# Patient Record
Sex: Female | Born: 1980 | Race: White | Hispanic: No | Marital: Married | Smoking: Current every day smoker
Health system: Southern US, Community
[De-identification: ages and names within clinical notes are randomized; demographics above are authoritative.]

## PROBLEM LIST (undated history)

## (undated) DIAGNOSIS — R002 Palpitations: Secondary | ICD-10-CM

## (undated) DIAGNOSIS — Q210 Ventricular septal defect: Secondary | ICD-10-CM

## (undated) DIAGNOSIS — F419 Anxiety disorder, unspecified: Secondary | ICD-10-CM

## (undated) HISTORY — DX: Palpitations: R00.2

## (undated) HISTORY — PX: TUBAL LIGATION: SHX77

## (undated) HISTORY — DX: Ventricular septal defect: Q21.0

---

## 2004-07-09 ENCOUNTER — Inpatient Hospital Stay (HOSPITAL_COMMUNITY): Admission: AD | Admit: 2004-07-09 | Discharge: 2004-07-09 | Payer: Self-pay | Admitting: *Deleted

## 2004-09-13 ENCOUNTER — Ambulatory Visit: Payer: Self-pay | Admitting: Obstetrics & Gynecology

## 2004-09-13 ENCOUNTER — Inpatient Hospital Stay (HOSPITAL_COMMUNITY): Admission: AD | Admit: 2004-09-13 | Discharge: 2004-09-13 | Payer: Self-pay | Admitting: Family Medicine

## 2004-10-05 ENCOUNTER — Ambulatory Visit (HOSPITAL_COMMUNITY): Admission: RE | Admit: 2004-10-05 | Discharge: 2004-10-05 | Payer: Self-pay | Admitting: Obstetrics & Gynecology

## 2004-12-21 ENCOUNTER — Ambulatory Visit (HOSPITAL_COMMUNITY): Admission: RE | Admit: 2004-12-21 | Discharge: 2004-12-21 | Payer: Self-pay | Admitting: Obstetrics & Gynecology

## 2004-12-28 ENCOUNTER — Ambulatory Visit: Payer: Self-pay | Admitting: Obstetrics and Gynecology

## 2004-12-28 ENCOUNTER — Inpatient Hospital Stay (HOSPITAL_COMMUNITY): Admission: AD | Admit: 2004-12-28 | Discharge: 2005-01-01 | Payer: Self-pay | Admitting: *Deleted

## 2004-12-31 ENCOUNTER — Ambulatory Visit: Payer: Self-pay | Admitting: Neonatology

## 2005-01-03 ENCOUNTER — Inpatient Hospital Stay (HOSPITAL_COMMUNITY): Admission: AD | Admit: 2005-01-03 | Discharge: 2005-01-03 | Payer: Self-pay | Admitting: Obstetrics and Gynecology

## 2005-01-03 ENCOUNTER — Ambulatory Visit: Payer: Self-pay | Admitting: *Deleted

## 2005-01-10 ENCOUNTER — Ambulatory Visit: Payer: Self-pay | Admitting: Family Medicine

## 2005-01-24 ENCOUNTER — Ambulatory Visit: Payer: Self-pay | Admitting: Family Medicine

## 2005-01-27 ENCOUNTER — Ambulatory Visit: Payer: Self-pay | Admitting: Certified Nurse Midwife

## 2005-01-27 ENCOUNTER — Inpatient Hospital Stay (HOSPITAL_COMMUNITY): Admission: AD | Admit: 2005-01-27 | Discharge: 2005-01-29 | Payer: Self-pay | Admitting: Family Medicine

## 2006-05-09 ENCOUNTER — Emergency Department (HOSPITAL_COMMUNITY): Admission: EM | Admit: 2006-05-09 | Discharge: 2006-05-09 | Payer: Self-pay | Admitting: Emergency Medicine

## 2006-05-30 ENCOUNTER — Emergency Department (HOSPITAL_COMMUNITY): Admission: EM | Admit: 2006-05-30 | Discharge: 2006-05-30 | Payer: Self-pay | Admitting: Emergency Medicine

## 2006-12-25 ENCOUNTER — Inpatient Hospital Stay (HOSPITAL_COMMUNITY): Admission: AD | Admit: 2006-12-25 | Discharge: 2006-12-25 | Payer: Self-pay | Admitting: Obstetrics & Gynecology

## 2007-02-11 IMAGING — US US OB LIMITED
1 series · 13 of 28 positions shown · non-contrast
Comparison: none

CLINICAL DATA: Left lower quadrant pain; assigned gestational age is 16 weeks 6 days.
TECHNIQUE: Complete ultrasound examination of the urinary tract was performed including evaluation of the kidneys, renal collecting systems, and urinary bladder.
 Both kidneys have a normal size, shape and echotexture.  The right kidney measures 11.4 cm, and the left kidney measures 11.3 cm.  No shadowing calculi or hydronephrosis are seen bilaterally.  Bilateral ureteral jets are seen within the bladder.

[Series 1: us ob limited · 0.33mm/px · 13 of 74 slices shown]
[im 3/74]
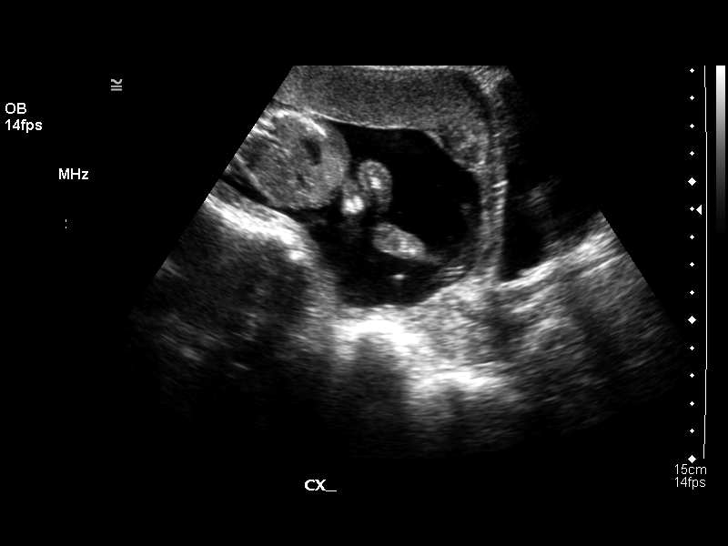
[im 9/74]
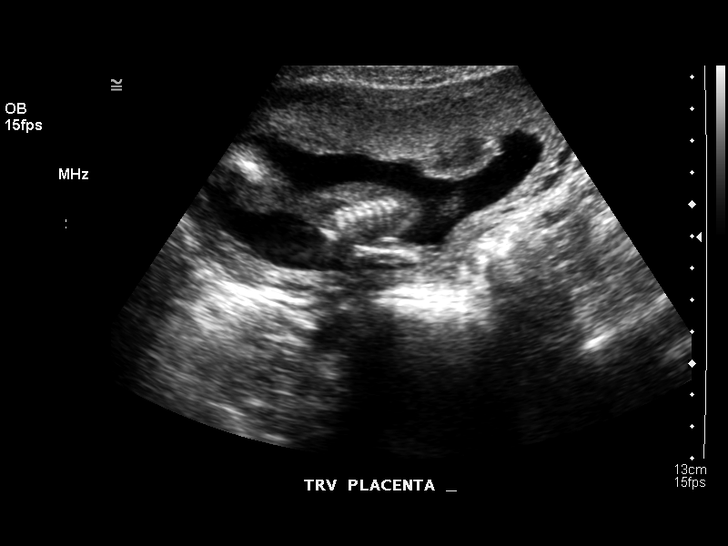
[im 14/74]
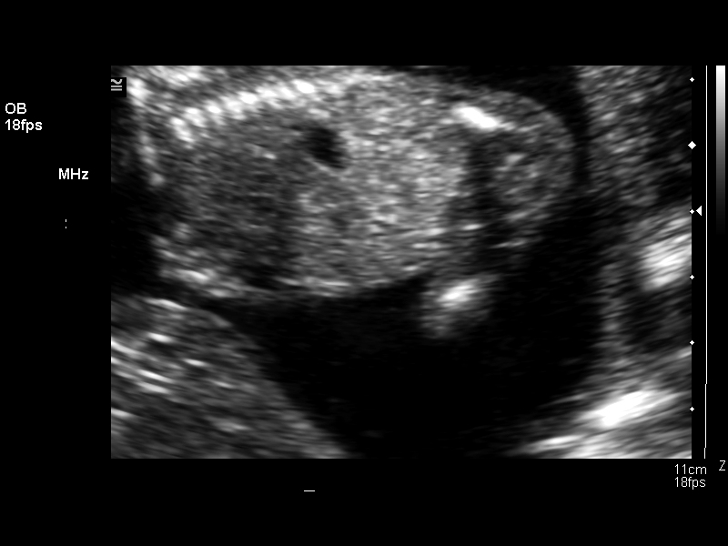
[im 19/74]
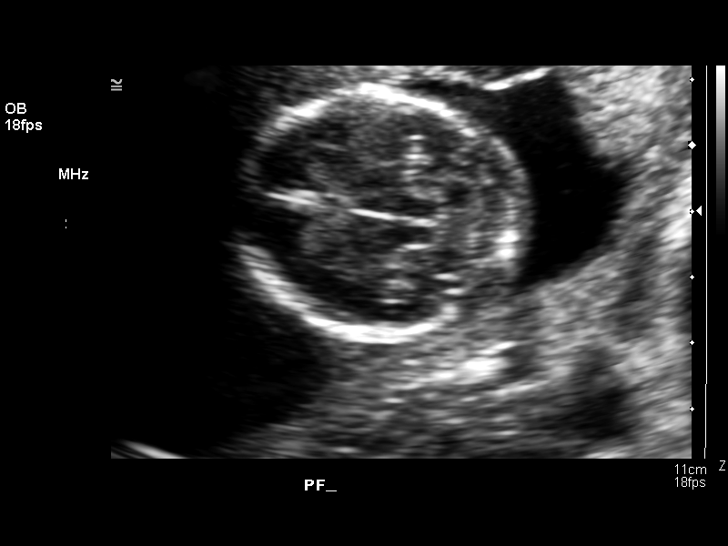
[im 25/74]
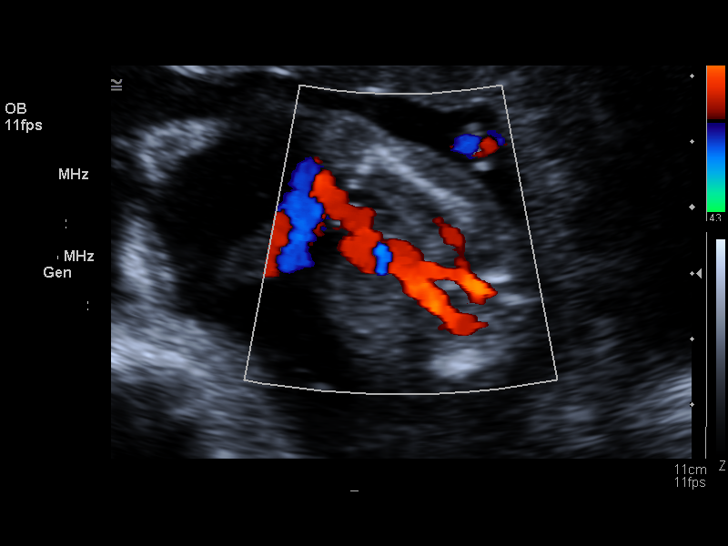
[im 30/74]
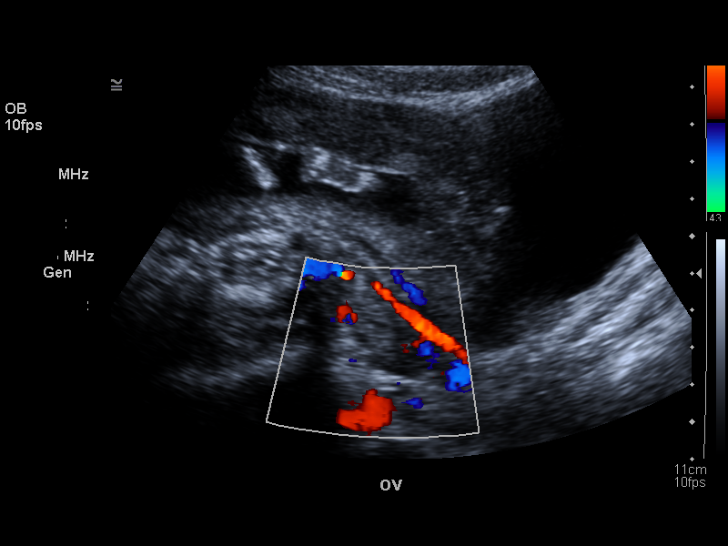
[im 38/74]
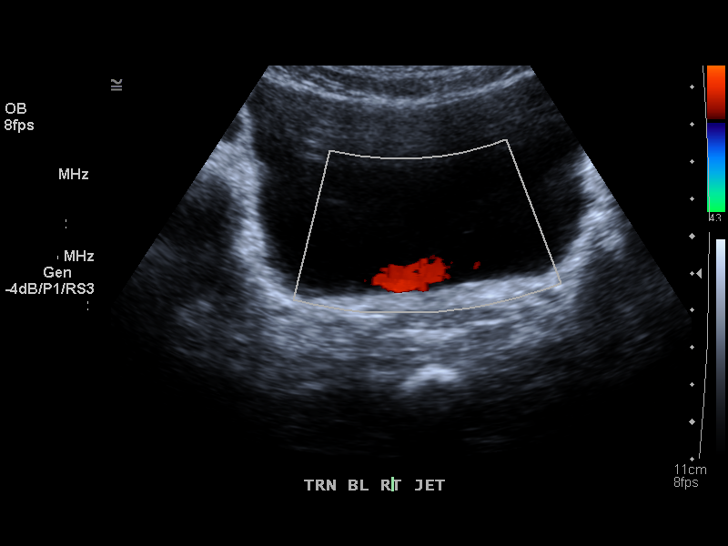
[im 44/74]
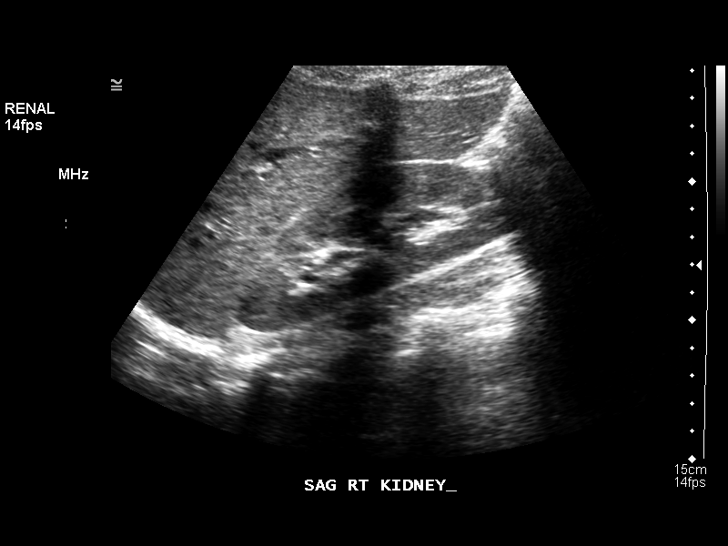
[im 49/74]
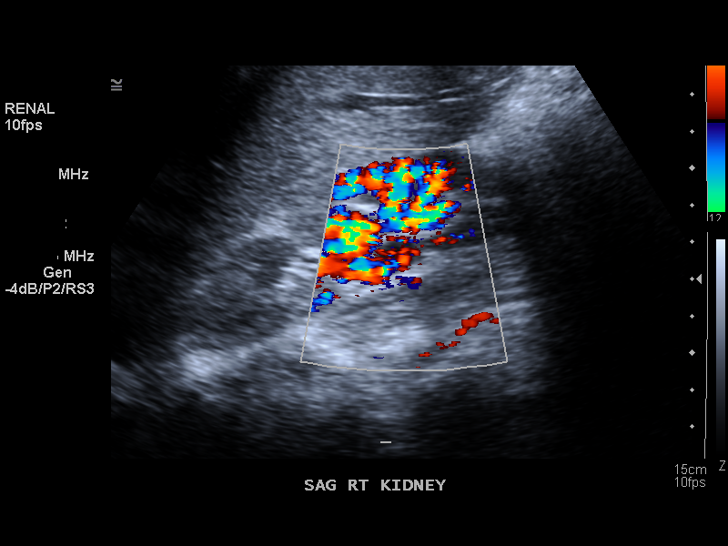
[im 55/74]
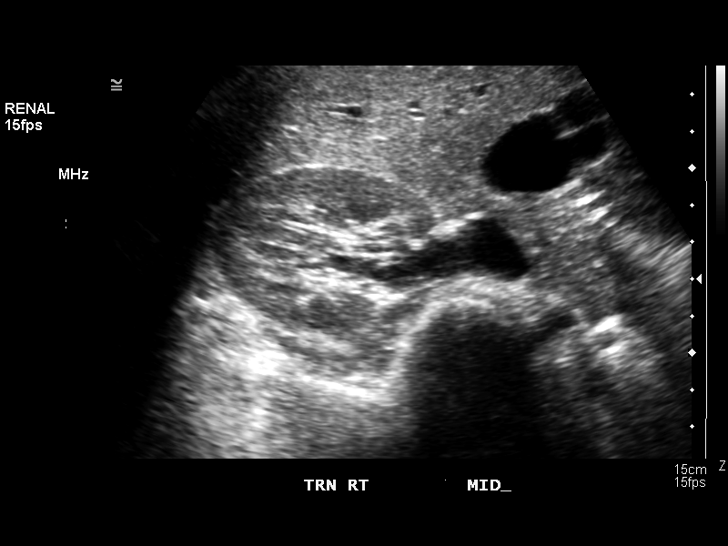
[im 60/74]
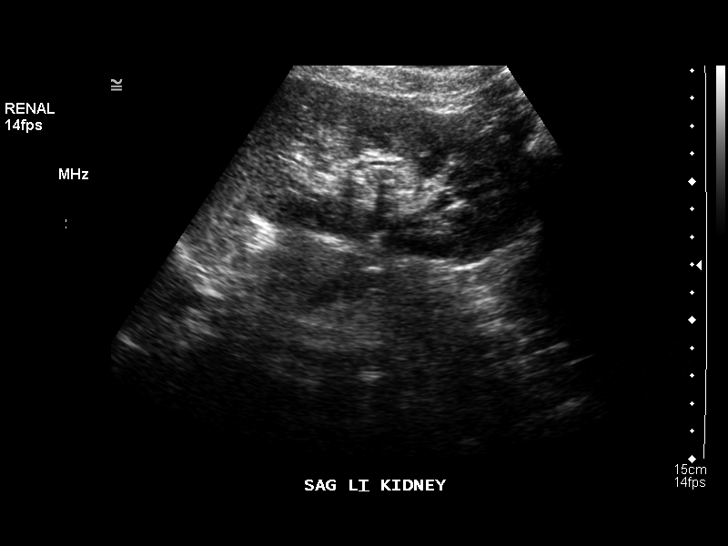
[im 65/74]
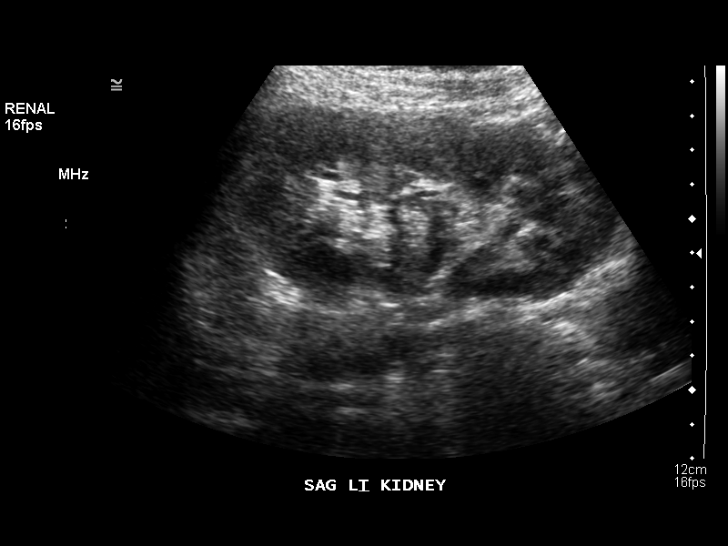
[im 71/74]
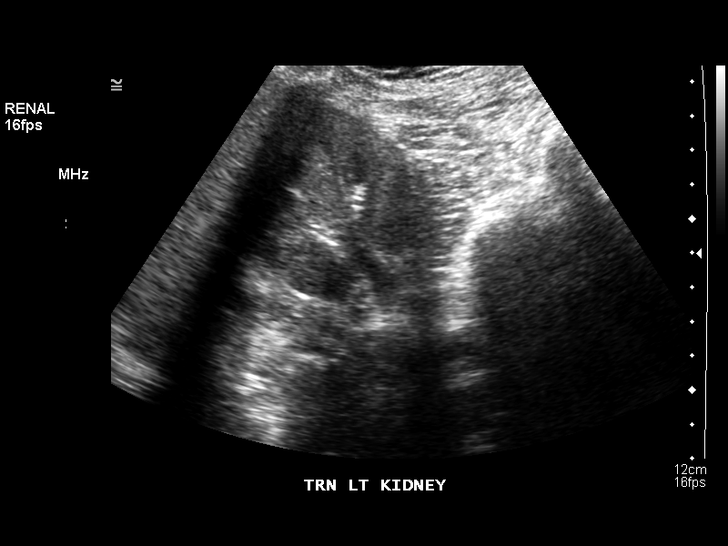

[13 of 28 positions shown; findings below may reference images not displayed]

LIMITED OBSTETRICAL ULTRASOUND:
 Number of Fetuses:  1
 Heart Rate:  153
 Movement:  Yes
 Breathing:  Yes
 Presentation:  Breech
 Placental Location:  Fundal, anterior
 Grade:  I
 Previa:  No
 Amniotic Fluid (Subjective):  Normal
 Amniotic Fluid (Objective): 4.1 cm Vertical pocket 

 Fetal measurements and complete anatomic evaluation were not requested.  The following fetal anatomy was visualized during this exam:  Choroid plexus, thalami, posterior fossa, stomach, 3-vessel cord, kidneys, 5th digit, and male genitalia.  

 MATERNAL UTERINE AND ADNEXAL FINDINGS
 Cervix:  3.2 cm Transabdominally.  The right ovary has a normal appearance and the left ovary could not be seen.  No adnexal masses or free fluid are identified.
IMPRESSION: There is a single living intrauterine gestation in variable presentation.  The visualized anatomy is unremarkable.  Full anatomy scan at 19 ? 21 weeks is recommended.  The placenta has a normal appearance with no evidence of marginal or retroplacental hemorrhage.  
 RENAL/URINARY TRACT ULTRASOUND:
IMPRESSION: Normal maternal renal ultrasound.

## 2007-06-01 ENCOUNTER — Emergency Department (HOSPITAL_COMMUNITY): Admission: EM | Admit: 2007-06-01 | Discharge: 2007-06-01 | Payer: Self-pay | Admitting: Emergency Medicine

## 2007-07-16 ENCOUNTER — Emergency Department (HOSPITAL_COMMUNITY): Admission: EM | Admit: 2007-07-16 | Discharge: 2007-07-16 | Payer: Self-pay | Admitting: Emergency Medicine

## 2007-08-19 ENCOUNTER — Encounter: Admission: RE | Admit: 2007-08-19 | Discharge: 2007-08-19 | Payer: Self-pay | Admitting: Family Medicine

## 2007-08-21 ENCOUNTER — Emergency Department (HOSPITAL_COMMUNITY): Admission: EM | Admit: 2007-08-21 | Discharge: 2007-08-21 | Payer: Self-pay | Admitting: Emergency Medicine

## 2007-10-29 ENCOUNTER — Emergency Department (HOSPITAL_COMMUNITY): Admission: EM | Admit: 2007-10-29 | Discharge: 2007-10-29 | Payer: Self-pay | Admitting: Emergency Medicine

## 2008-09-28 ENCOUNTER — Emergency Department (HOSPITAL_COMMUNITY): Admission: EM | Admit: 2008-09-28 | Discharge: 2008-09-28 | Payer: Self-pay | Admitting: Emergency Medicine

## 2008-10-27 IMAGING — CR DG HIP COMPLETE 2+V*R*
3 series · 3 of 3 positions shown · non-contrast
Comparison: none

CLINICAL DATA: Right hip pain status post fall. 
 RIGHT HIP ? 2 VIEW:

[t pelvis a.p. *]
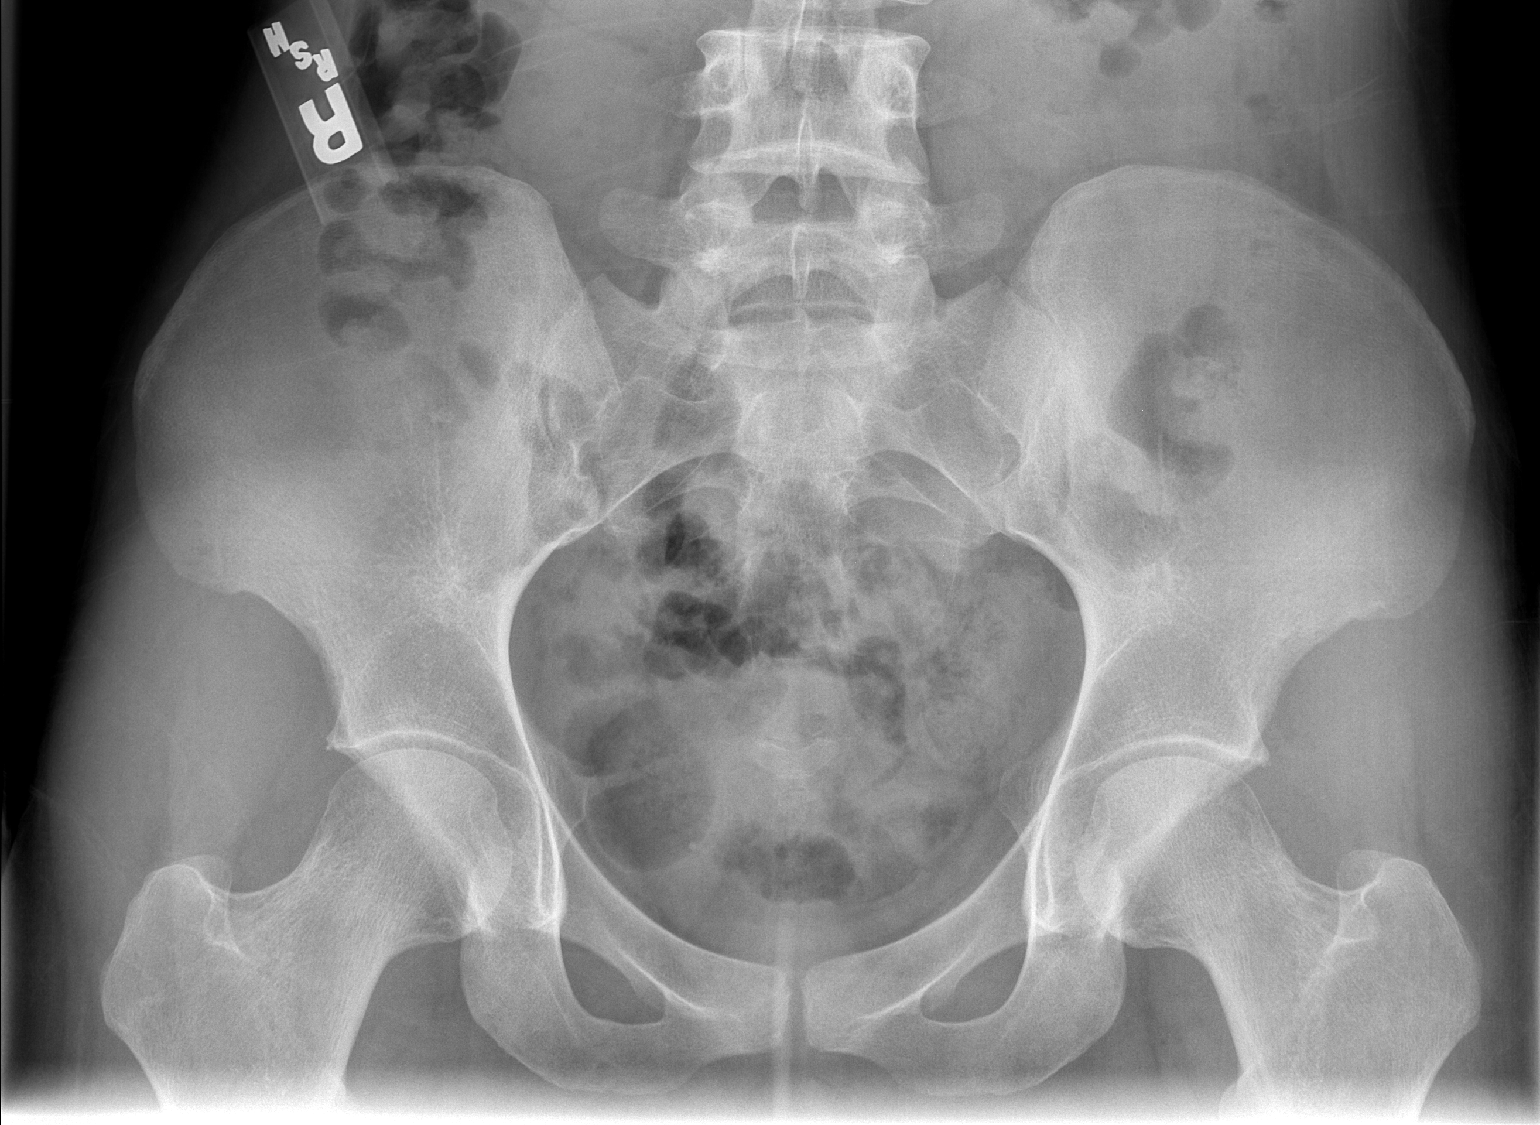

[t hip ap right *]
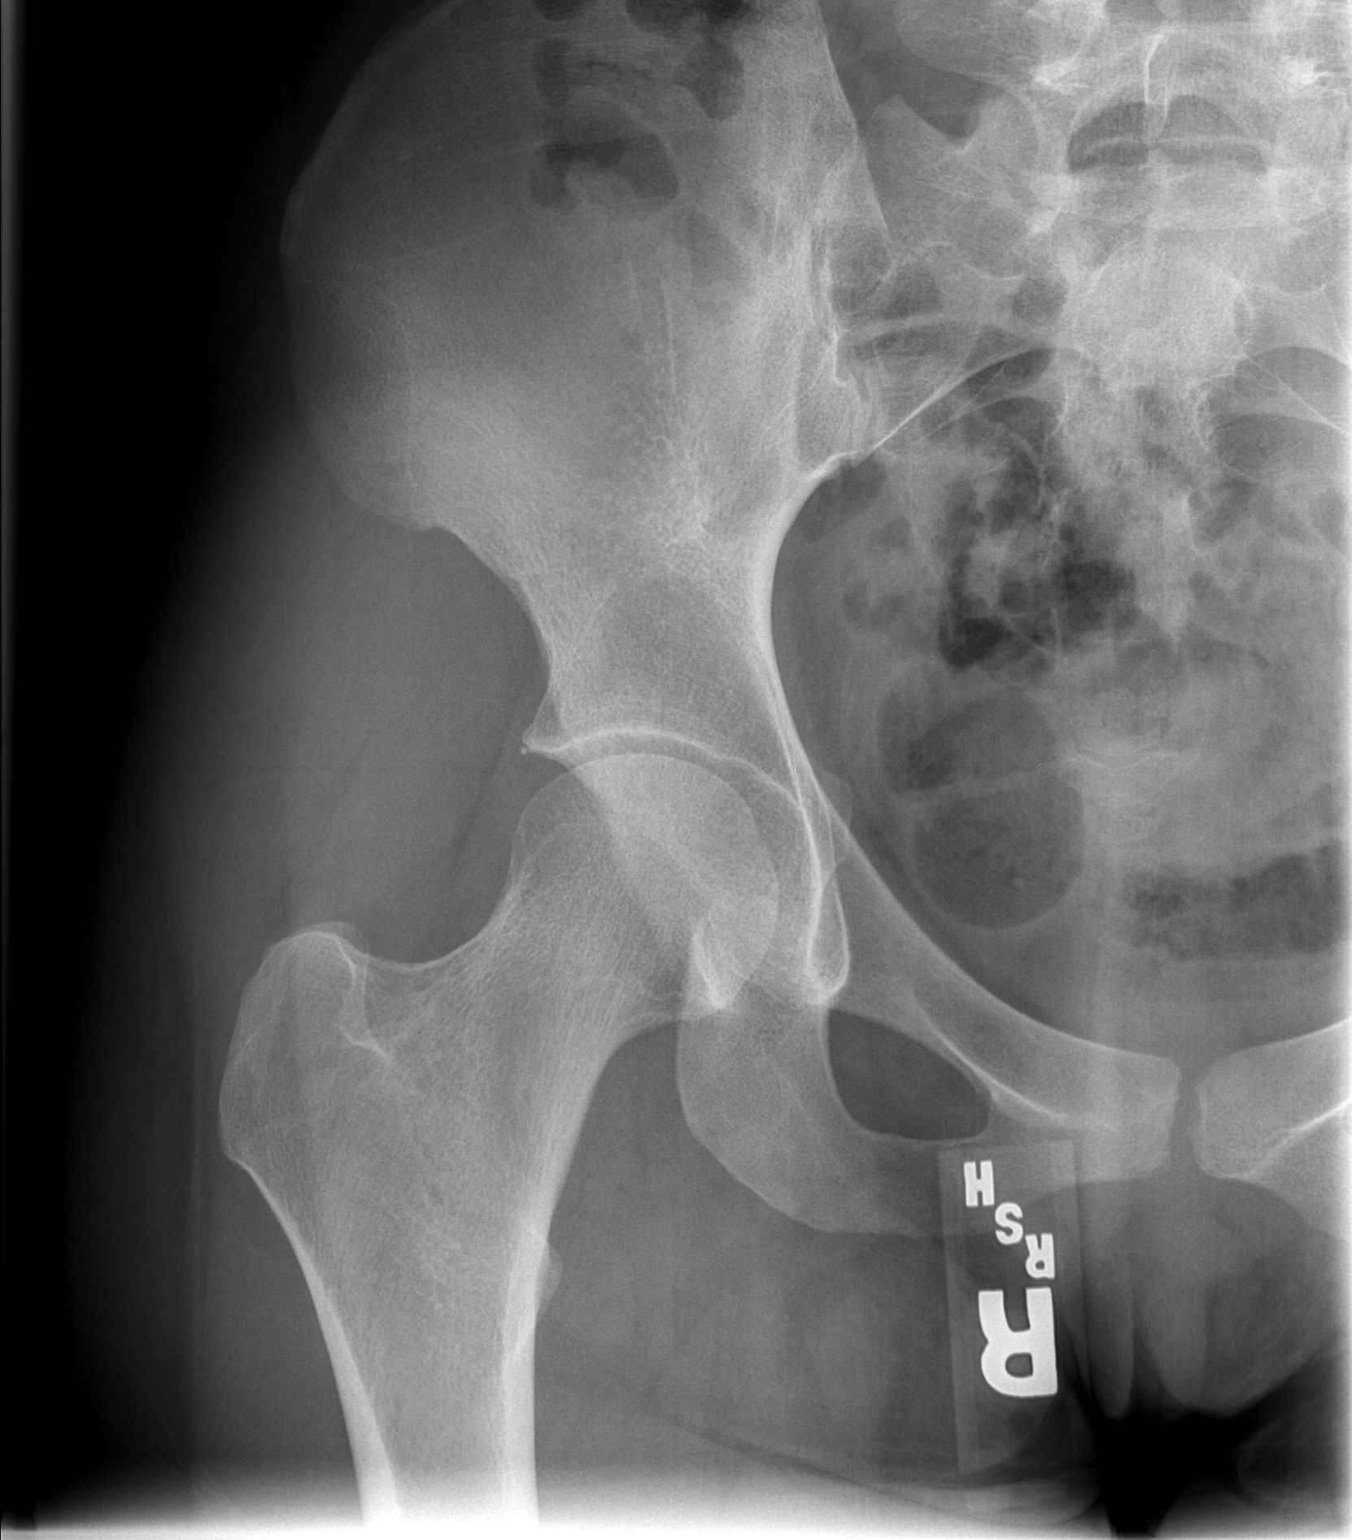

[t hip frog leg right *]
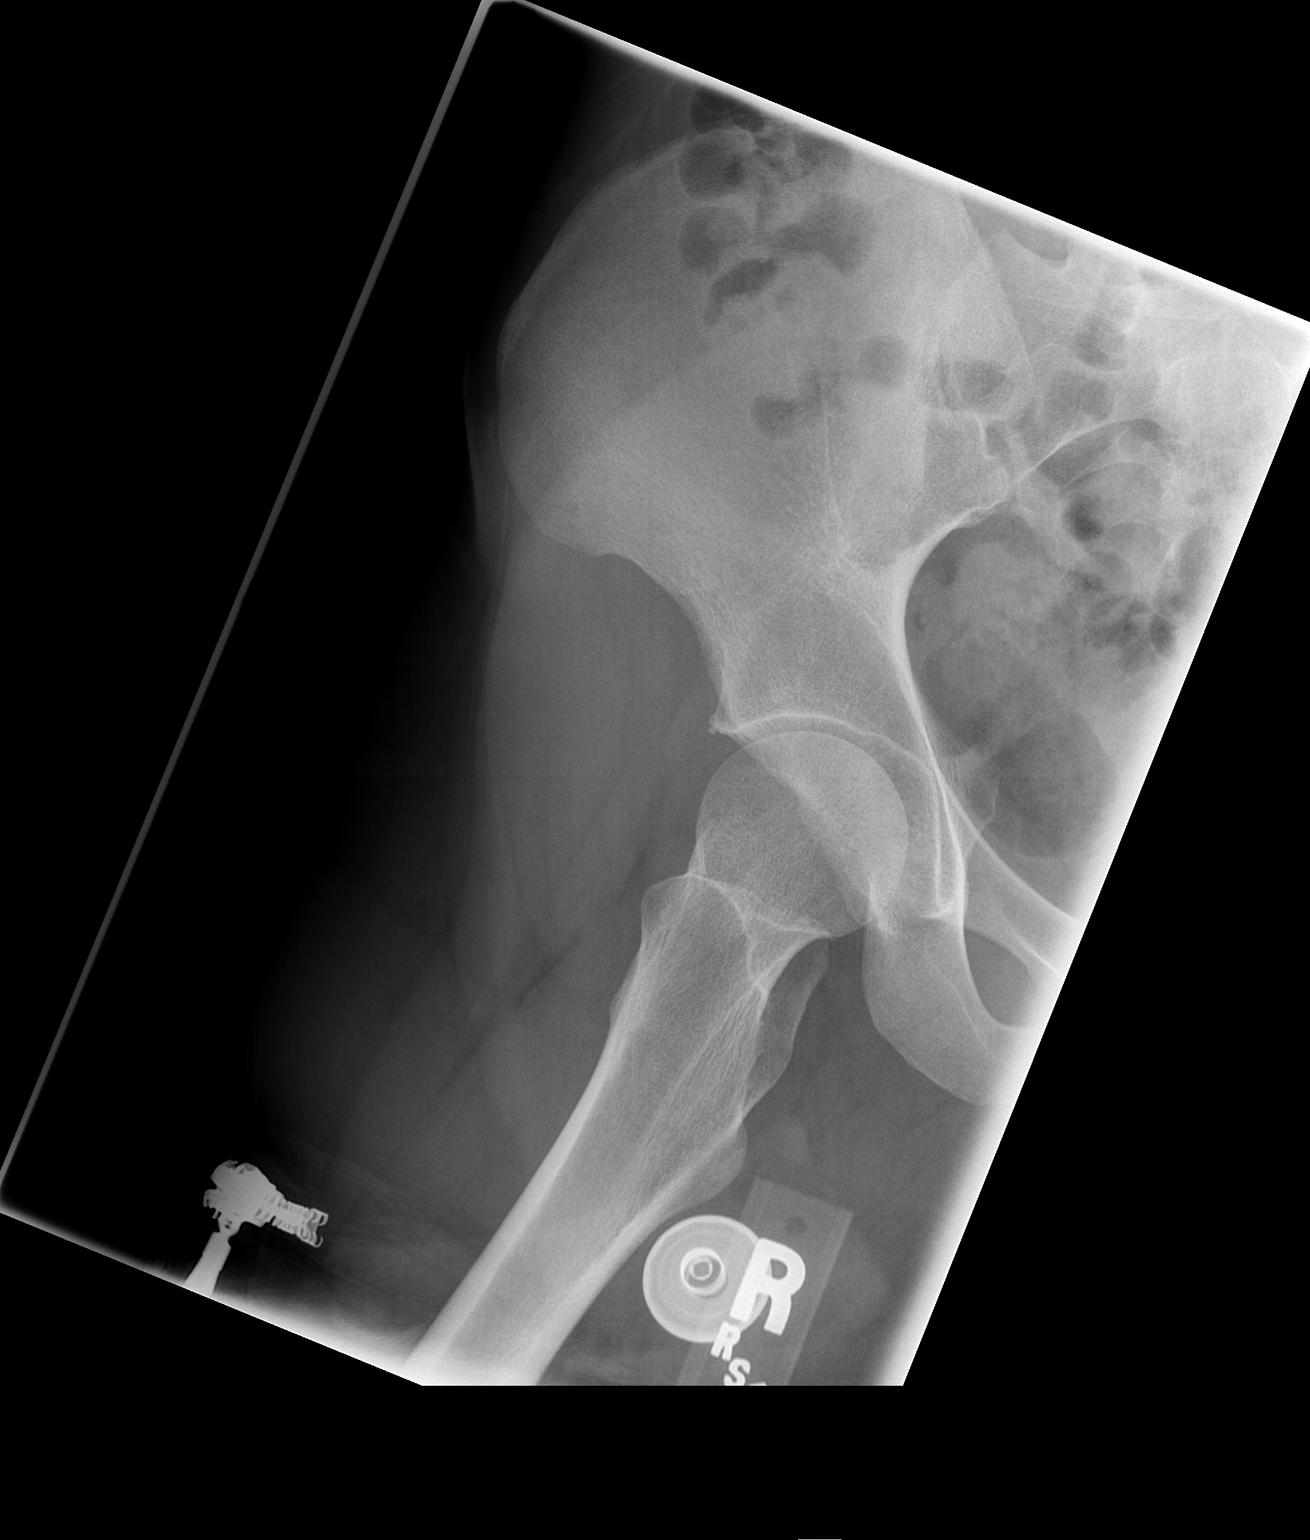

[3 of 3 positions shown; findings below may reference images not displayed]

FINDINGS: There is no evidence of hip fracture or dislocation.  There is no evidence of arthropathy or other focal bone abnormality.
IMPRESSION: Negative.

## 2009-10-03 ENCOUNTER — Inpatient Hospital Stay (HOSPITAL_COMMUNITY)
Admission: AD | Admit: 2009-10-03 | Discharge: 2009-10-03 | Payer: Self-pay | Source: Home / Self Care | Admitting: Obstetrics and Gynecology

## 2009-10-03 ENCOUNTER — Ambulatory Visit: Payer: Self-pay | Admitting: Obstetrics and Gynecology

## 2009-11-06 ENCOUNTER — Inpatient Hospital Stay (HOSPITAL_COMMUNITY)
Admission: AD | Admit: 2009-11-06 | Discharge: 2009-11-06 | Payer: Self-pay | Source: Home / Self Care | Admitting: Obstetrics & Gynecology

## 2009-11-06 ENCOUNTER — Ambulatory Visit: Payer: Self-pay | Admitting: Family

## 2009-11-23 ENCOUNTER — Ambulatory Visit: Payer: Self-pay | Admitting: Gynecology

## 2009-11-23 ENCOUNTER — Inpatient Hospital Stay (HOSPITAL_COMMUNITY)
Admission: AD | Admit: 2009-11-23 | Discharge: 2009-11-23 | Payer: Self-pay | Source: Home / Self Care | Admitting: Family Medicine

## 2009-12-14 ENCOUNTER — Ambulatory Visit (HOSPITAL_COMMUNITY): Admission: RE | Admit: 2009-12-14 | Discharge: 2009-12-14 | Payer: Self-pay | Admitting: Family Medicine

## 2009-12-26 ENCOUNTER — Encounter: Payer: Self-pay | Admitting: Cardiology

## 2010-01-09 ENCOUNTER — Encounter: Payer: Self-pay | Admitting: Family Medicine

## 2010-01-09 ENCOUNTER — Ambulatory Visit (HOSPITAL_COMMUNITY): Admission: RE | Admit: 2010-01-09 | Discharge: 2010-01-09 | Payer: Self-pay | Admitting: Family Medicine

## 2010-01-09 DIAGNOSIS — R51 Headache: Secondary | ICD-10-CM

## 2010-01-09 DIAGNOSIS — R55 Syncope and collapse: Secondary | ICD-10-CM

## 2010-01-09 DIAGNOSIS — I4949 Other premature depolarization: Secondary | ICD-10-CM

## 2010-01-09 DIAGNOSIS — R87619 Unspecified abnormal cytological findings in specimens from cervix uteri: Secondary | ICD-10-CM | POA: Insufficient documentation

## 2010-01-09 DIAGNOSIS — B977 Papillomavirus as the cause of diseases classified elsewhere: Secondary | ICD-10-CM | POA: Insufficient documentation

## 2010-01-09 DIAGNOSIS — R519 Headache, unspecified: Secondary | ICD-10-CM | POA: Insufficient documentation

## 2010-01-09 DIAGNOSIS — IMO0002 Reserved for concepts with insufficient information to code with codable children: Secondary | ICD-10-CM | POA: Insufficient documentation

## 2010-01-11 ENCOUNTER — Encounter: Payer: Self-pay | Admitting: Cardiology

## 2010-01-11 ENCOUNTER — Ambulatory Visit: Payer: Self-pay | Admitting: Cardiology

## 2010-01-11 DIAGNOSIS — I951 Orthostatic hypotension: Secondary | ICD-10-CM

## 2010-01-15 ENCOUNTER — Ambulatory Visit: Payer: Self-pay | Admitting: Obstetrics & Gynecology

## 2010-01-15 LAB — CONVERTED CEMR LAB: TSH: 0.508 microintl units/mL (ref 0.350–4.500)

## 2010-01-16 ENCOUNTER — Telehealth (INDEPENDENT_AMBULATORY_CARE_PROVIDER_SITE_OTHER): Payer: Self-pay | Admitting: *Deleted

## 2010-01-24 ENCOUNTER — Encounter: Payer: Self-pay | Admitting: Cardiology

## 2010-01-24 ENCOUNTER — Ambulatory Visit (HOSPITAL_COMMUNITY)
Admission: RE | Admit: 2010-01-24 | Discharge: 2010-01-24 | Payer: Self-pay | Source: Home / Self Care | Admitting: Cardiology

## 2010-01-24 ENCOUNTER — Ambulatory Visit: Payer: Self-pay | Admitting: Cardiology

## 2010-01-24 ENCOUNTER — Ambulatory Visit: Payer: Self-pay

## 2010-01-26 ENCOUNTER — Telehealth: Payer: Self-pay | Admitting: Cardiology

## 2010-02-09 ENCOUNTER — Inpatient Hospital Stay (HOSPITAL_COMMUNITY)
Admission: AD | Admit: 2010-02-09 | Discharge: 2010-02-09 | Payer: Self-pay | Source: Home / Self Care | Attending: Obstetrics & Gynecology | Admitting: Obstetrics & Gynecology

## 2010-02-09 ENCOUNTER — Inpatient Hospital Stay (HOSPITAL_COMMUNITY)
Admission: AD | Admit: 2010-02-09 | Discharge: 2010-02-09 | Payer: Self-pay | Source: Home / Self Care | Attending: Obstetrics and Gynecology | Admitting: Obstetrics and Gynecology

## 2010-02-12 ENCOUNTER — Ambulatory Visit: Payer: Self-pay | Admitting: Obstetrics and Gynecology

## 2010-02-12 ENCOUNTER — Encounter (INDEPENDENT_AMBULATORY_CARE_PROVIDER_SITE_OTHER): Payer: Self-pay | Admitting: *Deleted

## 2010-02-12 LAB — CONVERTED CEMR LAB
HIV: NONREACTIVE
MCV: 90.7 fL (ref 78.0–100.0)
RBC: 4.1 M/uL (ref 3.87–5.11)
WBC: 11.1 10*3/uL — ABNORMAL HIGH (ref 4.0–10.5)

## 2010-02-26 ENCOUNTER — Ambulatory Visit: Payer: Self-pay | Admitting: Obstetrics & Gynecology

## 2010-03-09 ENCOUNTER — Inpatient Hospital Stay (HOSPITAL_COMMUNITY)
Admission: AD | Admit: 2010-03-09 | Discharge: 2010-03-09 | Payer: Self-pay | Source: Home / Self Care | Attending: Obstetrics & Gynecology | Admitting: Obstetrics & Gynecology

## 2010-03-12 ENCOUNTER — Ambulatory Visit
Admission: RE | Admit: 2010-03-12 | Discharge: 2010-03-12 | Payer: Self-pay | Source: Home / Self Care | Attending: Obstetrics and Gynecology | Admitting: Obstetrics and Gynecology

## 2010-03-12 LAB — WET PREP, GENITAL
Clue Cells Wet Prep HPF POC: NONE SEEN
Trich, Wet Prep: NONE SEEN
Yeast Wet Prep HPF POC: NONE SEEN

## 2010-03-12 LAB — URINALYSIS, ROUTINE W REFLEX MICROSCOPIC
Bilirubin Urine: NEGATIVE
Hgb urine dipstick: NEGATIVE
Ketones, ur: NEGATIVE mg/dL
Nitrite: NEGATIVE
Protein, ur: NEGATIVE mg/dL
Specific Gravity, Urine: 1.015 (ref 1.005–1.030)
Urine Glucose, Fasting: NEGATIVE mg/dL
Urobilinogen, UA: 0.2 mg/dL (ref 0.0–1.0)
pH: 7 (ref 5.0–8.0)

## 2010-03-12 LAB — GC/CHLAMYDIA PROBE AMP, GENITAL
Chlamydia, DNA Probe: NEGATIVE
GC Probe Amp, Genital: NEGATIVE

## 2010-03-14 LAB — POCT URINALYSIS DIPSTICK
Bilirubin Urine: NEGATIVE
Ketones, ur: NEGATIVE mg/dL
Nitrite: NEGATIVE
Protein, ur: NEGATIVE mg/dL
Specific Gravity, Urine: 1.01 (ref 1.005–1.030)
Urine Glucose, Fasting: NEGATIVE mg/dL
Urobilinogen, UA: 0.2 mg/dL (ref 0.0–1.0)
pH: 6.5 (ref 5.0–8.0)

## 2010-03-16 ENCOUNTER — Inpatient Hospital Stay (HOSPITAL_COMMUNITY)
Admission: AD | Admit: 2010-03-16 | Discharge: 2010-03-16 | Payer: Self-pay | Source: Home / Self Care | Admitting: Obstetrics & Gynecology

## 2010-03-19 ENCOUNTER — Ambulatory Visit
Admission: RE | Admit: 2010-03-19 | Discharge: 2010-03-19 | Payer: Self-pay | Source: Home / Self Care | Attending: Obstetrics & Gynecology | Admitting: Obstetrics & Gynecology

## 2010-03-19 ENCOUNTER — Inpatient Hospital Stay (HOSPITAL_COMMUNITY)
Admission: AD | Admit: 2010-03-19 | Discharge: 2010-03-19 | Payer: Self-pay | Source: Home / Self Care | Attending: Obstetrics & Gynecology | Admitting: Obstetrics & Gynecology

## 2010-03-19 LAB — WET PREP, GENITAL
Clue Cells Wet Prep HPF POC: NONE SEEN
Trich, Wet Prep: NONE SEEN

## 2010-03-19 LAB — URINALYSIS, ROUTINE W REFLEX MICROSCOPIC
Protein, ur: NEGATIVE mg/dL
Urobilinogen, UA: 0.2 mg/dL (ref 0.0–1.0)

## 2010-03-20 LAB — POCT URINALYSIS DIPSTICK
Ketones, ur: NEGATIVE mg/dL
Protein, ur: NEGATIVE mg/dL
pH: 7 (ref 5.0–8.0)

## 2010-03-20 LAB — URINALYSIS, ROUTINE W REFLEX MICROSCOPIC
Bilirubin Urine: NEGATIVE
Nitrite: NEGATIVE
Specific Gravity, Urine: 1.01 (ref 1.005–1.030)
pH: 6.5 (ref 5.0–8.0)

## 2010-03-20 LAB — WET PREP, GENITAL

## 2010-03-20 LAB — GC/CHLAMYDIA PROBE AMP, GENITAL: GC Probe Amp, Genital: NEGATIVE

## 2010-03-26 ENCOUNTER — Ambulatory Visit: Admit: 2010-03-26 | Payer: Self-pay | Admitting: Obstetrics & Gynecology

## 2010-03-27 NOTE — Progress Notes (Signed)
Summary: Echo results  Phone Note Call from Patient Call back at Home Phone [phone]   Caller: Patient Summary of Call: Echo results Initial call taken by: Judie Grieve,  January 26, 2010 8:11 AM  Follow-up for Phone Call        I talked with pt and gave her echo results from 01/24/10

## 2010-03-27 NOTE — Assessment & Plan Note (Signed)
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 couamdin/ pt has ...   Visit Type:  new pt visit Referring Provider:  [facility] Primary Provider:  [facility] Dept.  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  History of Present Illness: Kaitlyn Vargas comes in today referred by Dr. 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  She is 30 years old. She is currently [redacted] weeks pregnant. This is her third pregnancy with one successful delivery. The other was a miscarriage.  She is a history of recurrent syncope particular last couple years. It can happen when she stands up too quickly and has happened getting out of a hot shower. Sometimes she just gets lightheaded when she bends over and then goes out.  She has a history of gestational hypertension. She says she drinks plenty of fluids.  He was told that she had a hole in her heart which she was born to close. We do not have any records.  She was evaluated by Dr. Caryn Section in  [place]. she was placed on Coumadin because of scar tissue in her heart. We do not have any records but will send for those. She stopped it on her own because it was causing the site more. She denied any bleeding or melena.  She has been seen in the emergency room for syncope. This was witnessed by her husband. She hit the right top of her head. Apparently he reported her going in and out 10 different times. She was shaking but has no history of seizure.  Her blood sugar at that time by EMS was 131  His chest x-ray in the chart from September of 2009. Heart size that time was normal lungs were hyperaerated.  Review of her clinic records shows a hemoglobin of 12.0.  Current Medications  (verified): 1)  Prenatal Vitamins 0.8 Mg Tabs (Prenatal Multivit-Min-Fe-Fa) .Marland Kitchen.. 1 Tab Once Daily  Allergies: 1)  ! Pcn 2)  ! Sulfa  Past History:  Past Medical History: Last updated: 01/09/2010 PREMATURE VENTRICULAR CONTRACTIONS (ICD-427.69) SYNCOPE (ICD-780.2) HYPERTENSION, GESTATIONAL (ICD-642.90) HEADACHE (ICD-784.0) PAP SMEAR, ABNORMAL (ICD-795.00) HUMAN PAPILLOMAVIRUS (ICD-079.4)  Past Surgical History: Last updated: 01/09/2010 No surgery to report  Family History: Last updated: 01/09/2010 Family History of Coronary Artery Disease:  Family History of Hypertension:  Family History of Diabetes:  Family H/O Seizure disorder  Social History: Last updated: 01/09/2010 Tobacco Use - Former.  Alcohol Use - no Drug Use - no..WAS USING MARIJUANA UNTIL POSITIVE PREGNANCY TEST AS PER 11/23/09 ED NOTE FROM [facility]  Risk Factors: Smoking Status: quit (01/09/2010)  Review of Systems       negative other than history of present illness  Vital Signs:  Patient profile:   30 year old female Height:      64 inches Weight:      135.12 pounds BMI:     23.28 Pulse rate:   80 / minute Pulse (ortho):   78 / minute Pulse rhythm:   regular BP sitting:   102 / 60  (left arm) Cuff size:   large  Vitals Entered By: Danielle Rankin, CMA (January 11, 2010 3:17 PM)  Serial Vital Signs/Assessments:  Time  Position  BP       Pulse  Resp  Temp     By 3:31 PM   Lying LA  90/54    76                    Danielle Rankin, [place] 3:33 PM   Sitting   92/57    70                    Danielle Rankin, [place] 3:34 PM   Standing  101/61   78                    Danielle Rankin, [place] 3:36 PM   Standing  106/58   82                    Danielle Rankin, [place] 3:38 PM   Standing  102/60   87                    Danielle Rankin, [place]  Comments: 3:31 PM no sxms By: Danielle Rankin, CMA  3:33 PM no sxms By: Danielle Rankin, CMA  3:34 PM no sxms By: Danielle Rankin, CMA  3:36 PM no sxms By: Danielle Rankin, CMA  3:38 PM no  sxms By: Danielle Rankin, CMA    Physical Exam  General:  Well developed, well nourished, in no acute distress. Head:  normocephalic and atraumatic Eyes:  PERRLA/EOM intact; conjunctiva and lids normal. Mouth:  poor dentition.   Neck:  Neck supple, no JVD. No masses, thyromegaly or abnormal cervical nodes. Chest Wall:  no deformities or breast masses noted Lungs:  Clear bilaterally to auscultation and percussion. Heart:  PMI nondisplaced, regular rate and rhythm, normal S1-S2, no significant murmur systolic or diastolic. Carotids shows equal bilaterally without bruits Abdomen:  gravida, good bowel sounds Msk:  Back normal, normal gait. Muscle strength and tone normal. Pulses:  pulses normal in all 4 extremities Extremities:  No clubbing or cyanosis. Neurologic:  Alert and oriented x 3. Skin:  Intact without lesions or rashes. Psych:  Normal affect.   Problems:  Medical Problems Added: 1)  Dx of Orthostatic Hypotension  (ICD-458.0)  Impression & Recommendations:  Problem # 1:  SYNCOPE (ICD-780.2) Assessment Deteriorated  I suspect this is vasovagal or neurocardiogenic syncope. She is orthostatic by exam. I have asked her to push fluids. We'll have to be careful with salt with her history of gestational hypertension in the past. We'll check a 2-D echocardiogram to rule out any pulmonary hypertension or any structural abnormality. I suspect it'll be normal. We'll also try to get the records from Dr. Caryn Section and we looked up on the Internet. She has signed a release. Orthostatic because his given and how to avoid syncope even with pregnancy reviewed at length.  Orders: EKG w/ Interpretation (93000) Echocardiogram (Echo)  Problem # 2:  HYPERTENSION, GESTATIONAL (ICD-642.90)  Problem # 3:  PREMATURE VENTRICULAR CONTRACTIONS (ICD-427.69) Assessment: Unchanged  Problem # 4:  ORTHOSTATIC HYPOTENSION (ICD-458.0) Assessment: New  Patient Instructions: 1)  Your physician recommends that  you schedule a follow-up appointment in: 6 months with Dr. Daleen Squibb 2)  Your physician recommends that you continue on your current medications as directed. 3)  Your physician has requested that you have an echocardiogram.  Echocardiography is a painless test that uses sound waves to create images of your heart. It provides your doctor with information about the size and shape of your  heart and how well your heart's chambers and valves are working.  This procedure takes approximately one hour. There are no restrictions for this procedure. 4)  Increase fluids and continue to watch your salt.

## 2010-03-27 NOTE — Progress Notes (Signed)
  LMOM for Pt to call back. Cala Bradford Mesiemore  January 16, 2010 11:00 AM      Appended Document:  Spoke with pt @ 1:50 today, Dr.Fox's Office is no longer in [place] so no is able to get records from his office. Will flag Debbie and let her know   Appended Document:   Reviewed Juanito Doom, MD

## 2010-03-29 NOTE — Progress Notes (Signed)
Summary: [facility]   [facility] Health[facility]   Imported By: Roderic Ovens 02/06/2010 11:27:08  _____________________________________________________________________  External Attachment:    Type:   Image     Comment:   External Document

## 2010-04-02 ENCOUNTER — Other Ambulatory Visit: Payer: Self-pay

## 2010-04-02 DIAGNOSIS — O0930 Supervision of pregnancy with insufficient antenatal care, unspecified trimester: Secondary | ICD-10-CM

## 2010-04-02 DIAGNOSIS — O99810 Abnormal glucose complicating pregnancy: Secondary | ICD-10-CM

## 2010-04-02 DIAGNOSIS — O09219 Supervision of pregnancy with history of pre-term labor, unspecified trimester: Secondary | ICD-10-CM

## 2010-04-02 LAB — POCT URINALYSIS DIPSTICK
Bilirubin Urine: NEGATIVE
Ketones, ur: NEGATIVE mg/dL
Urine Glucose, Fasting: NEGATIVE mg/dL

## 2010-04-18 ENCOUNTER — Encounter: Payer: Self-pay | Admitting: Obstetrics and Gynecology

## 2010-04-18 ENCOUNTER — Other Ambulatory Visit: Payer: Self-pay

## 2010-04-18 DIAGNOSIS — O09219 Supervision of pregnancy with history of pre-term labor, unspecified trimester: Secondary | ICD-10-CM

## 2010-04-18 LAB — POCT URINALYSIS DIPSTICK
Bilirubin Urine: NEGATIVE
Ketones, ur: NEGATIVE mg/dL
Specific Gravity, Urine: 1.01 (ref 1.005–1.030)
Urine Glucose, Fasting: NEGATIVE mg/dL

## 2010-04-18 LAB — CONVERTED CEMR LAB
Chlamydia, DNA Probe: NEGATIVE
GC Probe Amp, Genital: NEGATIVE

## 2010-04-19 ENCOUNTER — Encounter: Payer: Self-pay | Admitting: Obstetrics and Gynecology

## 2010-04-24 ENCOUNTER — Inpatient Hospital Stay (HOSPITAL_COMMUNITY)
Admission: AD | Admit: 2010-04-24 | Discharge: 2010-04-24 | Disposition: A | Discharge status: Home / Self Care | Payer: Medicaid Other | Source: Ambulatory Visit | Attending: Obstetrics & Gynecology | Admitting: Obstetrics & Gynecology

## 2010-04-24 DIAGNOSIS — O479 False labor, unspecified: Secondary | ICD-10-CM | POA: Insufficient documentation

## 2010-04-25 ENCOUNTER — Inpatient Hospital Stay (HOSPITAL_COMMUNITY)
Admission: AD | Admit: 2010-04-25 | Discharge: 2010-04-25 | Disposition: A | Discharge status: Home / Self Care | Payer: Medicaid Other | Source: Ambulatory Visit | Attending: Obstetrics & Gynecology | Admitting: Obstetrics & Gynecology

## 2010-04-25 DIAGNOSIS — S7000XA Contusion of unspecified hip, initial encounter: Secondary | ICD-10-CM

## 2010-04-25 DIAGNOSIS — O99891 Other specified diseases and conditions complicating pregnancy: Secondary | ICD-10-CM | POA: Insufficient documentation

## 2010-04-25 DIAGNOSIS — W010XXA Fall on same level from slipping, tripping and stumbling without subsequent striking against object, initial encounter: Secondary | ICD-10-CM | POA: Insufficient documentation

## 2010-04-25 DIAGNOSIS — O9989 Other specified diseases and conditions complicating pregnancy, childbirth and the puerperium: Secondary | ICD-10-CM

## 2010-04-25 DIAGNOSIS — Y92009 Unspecified place in unspecified non-institutional (private) residence as the place of occurrence of the external cause: Secondary | ICD-10-CM | POA: Insufficient documentation

## 2010-04-26 ENCOUNTER — Inpatient Hospital Stay (HOSPITAL_COMMUNITY): Payer: Medicaid Other

## 2010-04-26 ENCOUNTER — Inpatient Hospital Stay (HOSPITAL_COMMUNITY)
Admission: AD | Admit: 2010-04-26 | Discharge: 2010-04-26 | Disposition: A | Discharge status: Home / Self Care | Payer: Medicaid Other | Source: Ambulatory Visit | Attending: Obstetrics & Gynecology | Admitting: Obstetrics & Gynecology

## 2010-04-26 DIAGNOSIS — O479 False labor, unspecified: Secondary | ICD-10-CM | POA: Insufficient documentation

## 2010-05-03 ENCOUNTER — Other Ambulatory Visit: Payer: Self-pay

## 2010-05-03 DIAGNOSIS — O0930 Supervision of pregnancy with insufficient antenatal care, unspecified trimester: Secondary | ICD-10-CM

## 2010-05-03 DIAGNOSIS — O09219 Supervision of pregnancy with history of pre-term labor, unspecified trimester: Secondary | ICD-10-CM

## 2010-05-03 DIAGNOSIS — O99330 Smoking (tobacco) complicating pregnancy, unspecified trimester: Secondary | ICD-10-CM

## 2010-05-03 LAB — POCT URINALYSIS DIPSTICK
Bilirubin Urine: NEGATIVE
Ketones, ur: NEGATIVE mg/dL
Nitrite: NEGATIVE

## 2010-05-07 LAB — URINALYSIS, ROUTINE W REFLEX MICROSCOPIC
Ketones, ur: NEGATIVE mg/dL
Leukocytes, UA: NEGATIVE
Nitrite: NEGATIVE
Specific Gravity, Urine: <1.005 — ABNORMAL LOW (ref 1.005–1.030)
Urobilinogen, UA: 0.2 mg/dL (ref 0.0–1.0)
pH: 6 (ref 5.0–8.0)

## 2010-05-07 LAB — POCT URINALYSIS DIPSTICK
Bilirubin Urine: NEGATIVE
Bilirubin Urine: NEGATIVE
Glucose, UA: NEGATIVE mg/dL
Glucose, UA: NEGATIVE mg/dL
Hgb urine dipstick: NEGATIVE
Ketones, ur: NEGATIVE mg/dL
Nitrite: NEGATIVE
Nitrite: NEGATIVE
Urobilinogen, UA: 0.2 mg/dL (ref 0.0–1.0)
pH: 7 (ref 5.0–8.0)

## 2010-05-07 LAB — URINE MICROSCOPIC-ADD ON

## 2010-05-07 LAB — WET PREP, GENITAL: Clue Cells Wet Prep HPF POC: NONE SEEN

## 2010-05-07 LAB — GC/CHLAMYDIA PROBE AMP, GENITAL: Chlamydia, DNA Probe: NEGATIVE

## 2010-05-08 ENCOUNTER — Inpatient Hospital Stay (HOSPITAL_COMMUNITY)
Admission: AD | Admit: 2010-05-08 | Discharge: 2010-05-10 | DRG: 767 | Disposition: A | Discharge status: Home / Self Care | Payer: Medicaid Other | Source: Ambulatory Visit | Attending: Obstetrics & Gynecology | Admitting: Obstetrics & Gynecology

## 2010-05-08 DIAGNOSIS — Z302 Encounter for sterilization: Secondary | ICD-10-CM

## 2010-05-08 DIAGNOSIS — O0940 Supervision of pregnancy with grand multiparity, unspecified trimester: Secondary | ICD-10-CM

## 2010-05-08 LAB — POCT URINALYSIS DIPSTICK
Bilirubin Urine: NEGATIVE
Glucose, UA: NEGATIVE mg/dL
Ketones, ur: NEGATIVE mg/dL
Specific Gravity, Urine: 1.015 (ref 1.005–1.030)

## 2010-05-08 LAB — CBC
HCT: 42.1 % (ref 36.0–46.0)
Hemoglobin: 14.5 g/dL (ref 12.0–15.0)
MCV: 90 fL (ref 78.0–100.0)
RBC: 4.68 MIL/uL (ref 3.87–5.11)
RDW: 13.2 % (ref 11.5–15.5)
WBC: 18.8 10*3/uL — ABNORMAL HIGH (ref 4.0–10.5)

## 2010-05-09 DIAGNOSIS — Z302 Encounter for sterilization: Secondary | ICD-10-CM

## 2010-05-09 LAB — SURGICAL PCR SCREEN
MRSA, PCR: NEGATIVE
Staphylococcus aureus: NEGATIVE

## 2010-05-10 LAB — COMPREHENSIVE METABOLIC PANEL
ALT: 13 U/L (ref 0–35)
AST: 15 U/L (ref 0–37)
Alkaline Phosphatase: 37 U/L — ABNORMAL LOW (ref 39–117)
CO2: 20 mEq/L (ref 19–32)
Calcium: 9.1 mg/dL (ref 8.4–10.5)
GFR calc Af Amer: >60 mL/min (ref >60)
GFR calc non Af Amer: >60 mL/min (ref >60)
Glucose, Bld: 95 mg/dL (ref 70–99)
Potassium: 3.1 mEq/L — ABNORMAL LOW (ref 3.5–5.1)
Sodium: 132 mEq/L — ABNORMAL LOW (ref 135–145)
Total Protein: 7.4 g/dL (ref 6.0–8.3)

## 2010-05-10 LAB — CBC
HCT: 40.3 % (ref 36.0–46.0)
Hemoglobin: 13.9 g/dL (ref 12.0–15.0)
MCHC: 34.5 g/dL (ref 30.0–36.0)
WBC: 9.4 10*3/uL (ref 4.0–10.5)

## 2010-05-10 LAB — URINALYSIS, ROUTINE W REFLEX MICROSCOPIC
Bilirubin Urine: NEGATIVE
Glucose, UA: NEGATIVE mg/dL
Ketones, ur: NEGATIVE mg/dL
Protein, ur: NEGATIVE mg/dL
pH: 6 (ref 5.0–8.0)

## 2010-05-10 LAB — URINE MICROSCOPIC-ADD ON

## 2010-05-11 LAB — URINALYSIS, ROUTINE W REFLEX MICROSCOPIC
Glucose, UA: NEGATIVE mg/dL
Protein, ur: NEGATIVE mg/dL
Specific Gravity, Urine: 1.02 (ref 1.005–1.030)
pH: 8.5 — ABNORMAL HIGH (ref 5.0–8.0)

## 2010-05-11 LAB — WET PREP, GENITAL: Yeast Wet Prep HPF POC: NONE SEEN

## 2010-05-11 LAB — CBC
HCT: 39.8 % (ref 36.0–46.0)
MCV: 90.9 fL (ref 78.0–100.0)
RDW: 12.4 % (ref 11.5–15.5)
WBC: 7.2 10*3/uL (ref 4.0–10.5)

## 2010-05-11 LAB — POCT PREGNANCY, URINE: Preg Test, Ur: POSITIVE

## 2010-05-17 NOTE — Op Note (Signed)
NAME:  Kaitlyn Vargas, Kaitlyn Vargas NO.:  [id number]  MEDICAL RECORD NO.:  [id number]           PATIENT TYPE:  LOCATION:  107                            FACILITY:  PHYSICIAN:  Sebrina Kessner S. Shawnie Pons, M.D.   DATE OF BIRTH:  February 07, 1981  DATE OF PROCEDURE:  05/09/2010 DATE OF DISCHARGE:                              OPERATIVE REPORT   PREOPERATIVE DIAGNOSES:  Multiparity, undesired fertility.  POSTOPERATIVE DIAGNOSES:  Multiparity, undesired fertility.  PROCEDURE:  Postpartum tubal ligation with Filshie clip.  ANESTHESIA:  Epidural and local.  FINDINGS:  Normal-appearing tubes, appendix, and right ovary.  SPECIMENS:  None.  BLOOD LOSS:  Minimal.  COMPLICATIONS:  None known.  REASON FOR PROCEDURE:  Briefly, the patient is a 30 year old gravida 3, para 2-0-1-2 who desires permanent sterility.  She was postpartum day #1 from vaginal delivery of her second child.  She was counseled at length regarding permanency of the procedure, risk of failure of 1 in 100, increased risk of ectopic, the risk of bleeding, infection, and injury to surrounding structures was all discussed.  The patient agreed to proceed.  PROCEDURE:  The patient was taken to the OR where epidural analgesia was dosed.  When anesthesia was felt to be adequate, she was prepped and draped in the usual sterile fashion and her bladder was drained.  A time- out was performed.  Allis clamp was used to ensure that the infra umbilicus was adequately numb which it was.  7 mL of local was injected at the umbilicus.  Two Allis clamps were used to tent up the umbilical skin which was cut at approximately 0.5 cm.  This was carried down to the underlying fascia and peritoneal cavity was entered sharply.  The patient was then placed in Trendelenburg.  The retractors were placed inside the abdomen.  The patient's left tube was initially found, brought up to the umbilicus, followed to its fimbriated end.  A Filshie clip was  placed across the tube, approximately 2 cm from the cornu, over a thin piece of tube.  An 1 mL of local was drizzled over the tube and this tube was allowed to return to the abdomen.  On the first attempt to grab the patient's right tube.  The appendix was grasped and then brought up and was normal in size.  Secondly, the right tube was grasped and followed to its fimbriated end.  The ovary was visualized while following the tube and was normal.  Approximately 2 cm from the cornu, the Filshie clip was placed across the thin segment of tube here.  1 mL of local was drizzled over the tube and the tube allowed to fall back into the abdomen.  The fascia was then grasped with two Allis clamps and closed with a 0 Vicryl suture in a running fashion, subcuticular closure was done with 3-0 Vicryl in a subcuticular fashion.  Dermabond was placed over the incision.  All instrument and lap counts were correct x2.  The patient was transferred to recovery room in stable condition.     Shelbie Proctor. Shawnie Pons, M.D.     TSP/MEDQ  D:  05/09/2010  T:  05/10/2010  Job:  [id number]  Electronically Signed by Tinnie Gens M.D. on 05/17/2010 11:08:47 AM

## 2010-06-28 ENCOUNTER — Ambulatory Visit: Payer: Medicaid Other | Admitting: Obstetrics and Gynecology

## 2010-06-29 NOTE — Group Therapy Note (Signed)
NAME:  Kaitlyn Vargas, Kaitlyn Vargas NO.:  [id number]  MEDICAL RECORD NO.:  [id number]           PATIENT TYPE:  A  LOCATION:  [facility]                   FACILITY:  [facility]  PHYSICIAN:  Argentina Donovan, MD        DATE OF BIRTH:  [birth year]  DATE OF SERVICE:  06/28/2010                                 CLINIC NOTE  The patient is a 30 year old gravida 3, para 2-1-1-1 whose previous baby had Asperger's, Gilbert disease, hernias, and ptosis.  She had third preterm labor at this time with a history of preterm delivery.  This baby was supposed to have a heart defect but was born female, weighing 6 pounds with Apgars of 8 and 9 in perfect health.  The patient had a tubal ligation postpartum.  She nursed until her milk dried up and she is not due for a Pap smear until August 2012.  She has no complaints. No problems.  Her Edinburgh Postnatal Depression Scale was zero.  The patient will return in August for a Pap smear.          ______________________________ Argentina Donovan, MD    PR/MEDQ  D:  06/28/2010  T:  06/29/2010  Job:  [id number]

## 2010-07-13 NOTE — Discharge Summary (Signed)
NAME:  Kaitlyn Vargas, Kaitlyn Vargas            ACCOUNT NO.:  [id number]   MEDICAL RECORD NO.:  [id number]          PATIENT TYPE:  INP   LOCATION:  9154                          [facility]   PHYSICIAN:  Angie B. Merlene Morse, MD  DATE OF BIRTH:  [birth year]   DATE OF ADMISSION:  12/28/2004  DATE OF DISCHARGE:  01/01/2005                                 DISCHARGE SUMMARY   ADMISSION DIAGNOSES:  1.  Weight gain.  2.  Hyperreflexia.  3.  Elevated blood pressure.   DISCHARGE DIAGNOSES:  1.  Weight gain.  2.  Hyperreflexia.  3.  Elevated blood pressure.   ADMISSION HISTORY:  The patient is a 30 year old G1 at 31-6/7 weeks by LMP,  consistent with a 7-week ultrasound, who came in after being sent over from  [name], found to be contracting every 2 to 4 minutes on the monitor.  The patient had mildly elevated blood pressures there as well as having  weight gain and being found hyperreflexic, and mild right upper quadrant  pain. They had drawn preeclampsia labs on her and when placing her on the  monitor, found her to be contracting so sent her to the MAU. The patient  states she could feel some contractions, not initially, but upon arrival at  MAU was feeling contractions. Her membranes were intact and no vaginal  bleeding, and she felt the baby moving. She denied headache or visual  change. She did say she felt right upper quadrant pain that she thought was  the baby moving.   OBSTETRIC HISTORY:  She is a G1.   GYN HISTORY:  No abnormal Pap smear. No STDs.   PAST MEDICAL HISTORY:  None.   PAST SURGICAL HISTORY:  None.   SOCIAL HISTORY:  Negative x3.   PHYSICAL EXAMINATION:  Her temperature was 97.5, pulse 79, respiratory rate  16, blood pressure 121/56. She was well-developed, well-nourished, in no  acute distress. Cardiovascular - normal S1, S2, no murmurs, rubs, or  gallops. Chest clear to auscultation bilaterally. Abdomen gravid. She did  have 3+ deep tendon reflexes but no  edema. On exam her cervix was finger  tip, thick, high and soft.   HOSPITAL COURSE:  The patient was admitted. She was given two doses of  subcutaneous Terbutaline and then started on p.o. Terbutaline which was  stopped on Saturday secondary to maternal tachycardia and no contractions.  The patient began contracting intermittently again, her cervix had dilated  to 1, but remained 1 cm x3 days. The patient had GC/Chlamydia, wet prep and  GBS done which were all negative. She also had an ultrasound done which  showed a BPP of 8 out of 8. Uterine artery Doppler 3.03 which was normal.  The patient also had a 24 hour urine done which showed total volume of about  2300, protein of 163, creatinine clearance of 137. A repeat was done which  showed a volume of 5000, creatinine clearance of 158, protein was less than  6. The patient's right upper quadrant pain resolved and her cervix had not  changed at the time of discharge.   DISCHARGE  INSTRUCTIONS:  The patient was discharged home on modified  bedrest. She is to [facility] and they will call  her for an appointment. The patient was also discharged on Procardia.           ______________________________  August Saucer. Merlene Morse, MD     ABC/MEDQ  D:  01/01/2005  T:  01/01/2005  Job:  [id number]

## 2010-11-20 LAB — COMPREHENSIVE METABOLIC PANEL
ALT: 17
Alkaline Phosphatase: 36 — ABNORMAL LOW
CO2: 25
GFR calc non Af Amer: >60
Glucose, Bld: 92
Potassium: 4.2
Sodium: 137

## 2010-11-20 LAB — CBC
Hemoglobin: 14.5
RBC: 4.77
WBC: 7.4

## 2010-11-20 LAB — DIFFERENTIAL
Basophils Relative: 1
Eosinophils Absolute: 0.1
Eosinophils Relative: 2
Monocytes Relative: 8
Neutrophils Relative %: 51

## 2010-11-20 LAB — POCT PREGNANCY, URINE
Operator id: 151321
Preg Test, Ur: NEGATIVE

## 2010-11-20 LAB — URINALYSIS, ROUTINE W REFLEX MICROSCOPIC
Glucose, UA: NEGATIVE
Hgb urine dipstick: NEGATIVE
Protein, ur: NEGATIVE
Specific Gravity, Urine: 1.013

## 2010-11-20 LAB — LIPASE, BLOOD: Lipase: 20

## 2010-11-28 LAB — POCT I-STAT, CHEM 8
BUN: 10
Calcium, Ion: 1.02 — ABNORMAL LOW
Chloride: 112
HCT: 42
Potassium: 3.8

## 2010-11-28 LAB — URINALYSIS, ROUTINE W REFLEX MICROSCOPIC
Bilirubin Urine: NEGATIVE
Glucose, UA: NEGATIVE
Ketones, ur: NEGATIVE
Protein, ur: NEGATIVE

## 2010-11-28 LAB — PROTIME-INR: INR: 1.1

## 2010-12-05 LAB — CBC
HCT: 38.5
Hemoglobin: 13.3
MCV: 88.8
Platelets: 305
RBC: 4.33
WBC: 9.9

## 2010-12-05 LAB — HCG, QUANTITATIVE, PREGNANCY: hCG, Beta Chain, Quant, S: <2

## 2010-12-05 LAB — ABO/RH: ABO/RH(D): O POS

## 2010-12-26 ENCOUNTER — Emergency Department (HOSPITAL_COMMUNITY): Payer: Self-pay

## 2010-12-26 ENCOUNTER — Emergency Department (HOSPITAL_COMMUNITY)
Admission: EM | Admit: 2010-12-26 | Discharge: 2010-12-26 | Disposition: A | Discharge status: Home / Self Care | Payer: Self-pay | Attending: Emergency Medicine | Admitting: Emergency Medicine

## 2010-12-26 DIAGNOSIS — R0602 Shortness of breath: Secondary | ICD-10-CM | POA: Insufficient documentation

## 2010-12-26 DIAGNOSIS — R079 Chest pain, unspecified: Secondary | ICD-10-CM | POA: Insufficient documentation

## 2010-12-26 LAB — CBC
HCT: 40.2 % (ref 36.0–46.0)
MCHC: 33.1 g/dL (ref 30.0–36.0)
Platelets: 236 10*3/uL (ref 150–400)
RDW: 12.1 % (ref 11.5–15.5)
WBC: 6.3 10*3/uL (ref 4.0–10.5)

## 2010-12-26 LAB — D-DIMER, QUANTITATIVE: D-Dimer, Quant: 0.51 ug/mL-FEU — ABNORMAL HIGH (ref 0.00–0.48)

## 2010-12-26 LAB — BASIC METABOLIC PANEL
Calcium: 9.2 mg/dL (ref 8.4–10.5)
Creatinine, Ser: 0.71 mg/dL (ref 0.50–1.10)
GFR calc Af Amer: >90 mL/min (ref >90)
GFR calc non Af Amer: >90 mL/min (ref >90)
Sodium: 137 mEq/L (ref 135–145)

## 2010-12-26 LAB — DIFFERENTIAL
Basophils Relative: 1 % (ref 0–1)
Lymphocytes Relative: 32 % (ref 12–46)
Lymphs Abs: 2 10*3/uL (ref 0.7–4.0)
Monocytes Relative: 10 % (ref 3–12)
Neutro Abs: 3.6 10*3/uL (ref 1.7–7.7)
Neutrophils Relative %: 56 % (ref 43–77)

## 2010-12-26 LAB — POCT I-STAT TROPONIN I: Troponin i, poc: 0 ng/mL (ref 0.00–0.08)

## 2011-01-28 ENCOUNTER — Emergency Department (HOSPITAL_COMMUNITY)
Admission: EM | Admit: 2011-01-28 | Discharge: 2011-01-28 | Disposition: A | Discharge status: Home / Self Care | Payer: Self-pay | Attending: Emergency Medicine | Admitting: Emergency Medicine

## 2011-01-28 ENCOUNTER — Encounter: Payer: Self-pay | Admitting: Emergency Medicine

## 2011-01-28 DIAGNOSIS — R42 Dizziness and giddiness: Secondary | ICD-10-CM | POA: Insufficient documentation

## 2011-01-28 DIAGNOSIS — R55 Syncope and collapse: Secondary | ICD-10-CM | POA: Insufficient documentation

## 2011-01-28 LAB — COMPREHENSIVE METABOLIC PANEL
ALT: 8 U/L (ref 0–35)
AST: 10 U/L (ref 0–37)
Albumin: 3.6 g/dL (ref 3.5–5.2)
CO2: 21 mEq/L (ref 19–32)
Calcium: 8.6 mg/dL (ref 8.4–10.5)
Chloride: 111 mEq/L (ref 96–112)
Creatinine, Ser: 0.61 mg/dL (ref 0.50–1.10)
GFR calc non Af Amer: >90 mL/min (ref >90)
Sodium: 141 mEq/L (ref 135–145)

## 2011-01-28 LAB — DIFFERENTIAL
Basophils Absolute: 0 10*3/uL (ref 0.0–0.1)
Basophils Relative: 1 % (ref 0–1)
Eosinophils Relative: 0 % (ref 0–5)
Lymphocytes Relative: 21 % (ref 12–46)
Monocytes Absolute: 0.5 10*3/uL (ref 0.1–1.0)
Neutro Abs: 5.5 10*3/uL (ref 1.7–7.7)

## 2011-01-28 LAB — CBC
MCHC: 34.1 g/dL (ref 30.0–36.0)
Platelets: 250 10*3/uL (ref 150–400)
RDW: 12.5 % (ref 11.5–15.5)
WBC: 7.5 10*3/uL (ref 4.0–10.5)

## 2011-01-28 LAB — D-DIMER, QUANTITATIVE: D-Dimer, Quant: 0.46 ug/mL-FEU (ref 0.00–0.48)

## 2011-01-28 MED ORDER — SODIUM CHLORIDE 0.9 % IV BOLUS (SEPSIS)
1000.0000 mL | Freq: Once | INTRAVENOUS | Status: AC
  Administered 2011-01-28: 1000 mL via INTRAVENOUS

## 2011-01-28 MED ORDER — SODIUM CHLORIDE 0.9 % IV BOLUS (SEPSIS): 1000.0000 mL | Freq: Once | INTRAVENOUS | Status: DC

## 2011-01-28 MED ORDER — POTASSIUM CHLORIDE CRYS ER 20 MEQ PO TBCR: 20.0000 meq | EXTENDED_RELEASE_TABLET | Freq: Once | ORAL | Status: DC

## 2011-01-28 MED ORDER — ONDANSETRON HCL 4 MG/2ML IJ SOLN
4.0000 mg | Freq: Once | INTRAMUSCULAR | Status: AC
  Administered 2011-01-28: 4 mg via INTRAVENOUS
  Filled 2011-01-28: qty 2
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
## 2011-01-28 NOTE — ED Notes (Signed)
Social worker at bedside.  Pt is refusing help and is asking to leave AMA
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
## 2011-01-28 NOTE — ED Provider Notes (Signed)
History     CSN: [id number] Arrival date & time: 01/28/2011  9:31 AM   First MD Initiated Contact with Patient 01/28/11 [phone]      Chief Complaint  Patient presents with  . Loss of Consciousness    (Consider location/radiation/quality/duration/timing/severity/associated sxs/prior treatment) HPI Comments: Patient presents after a syncopal event per EMS. She's walking around the house when she felt a carpeted surface. She had some preceding lightheadedness and nausea. She is on her normal menstrual cycle. She denies chest pain, shortness of breath, abdominal pain, vomiting. Her symptoms have resolved. Has a history of a similar episode 2 months prior.  Patient is a 30 y.o. female presenting with syncope.  Loss of Consciousness This is a recurrent problem. The current episode started 1 to 2 hours ago. The problem occurs rarely. The problem has been resolved. Pertinent negatives include no chest pain, no abdominal pain, no headaches and no shortness of breath. The symptoms are aggravated by nothing. The symptoms are relieved by nothing.    Past Medical History  Diagnosis Date  . ASD (atrial septal defect)     No past surgical history on file.  No family history on file.  History  Substance Use Topics  . Smoking status: Not on file  . Smokeless tobacco: Not on file  . Alcohol Use:     OB History    Grav Para Term Preterm Abortions TAB SAB Ect Mult Living                  Review of Systems  Constitutional: Negative for fever, activity change, appetite change and fatigue.  HENT: Negative for congestion, sore throat, rhinorrhea, neck pain and neck stiffness.   Respiratory: Negative for cough and shortness of breath.   Cardiovascular: Positive for syncope. Negative for chest pain and palpitations.  Gastrointestinal: Negative for nausea, vomiting and abdominal pain.  Genitourinary: Positive for vaginal bleeding. Negative for dysuria, urgency, frequency, flank pain and vaginal  discharge.  Musculoskeletal: Negative for myalgias, back pain and arthralgias.  Neurological: Positive for syncope and light-headedness. Negative for dizziness, weakness, numbness and headaches.  All other systems reviewed and are negative.    Allergies  Penicillins and Sulfonamide derivatives  Home Medications  No current outpatient prescriptions on file.  BP 115/63  Pulse 72  SpO2 98%  Physical Exam  Nursing note and vitals reviewed. Constitutional: She is oriented to person, place, and time. She appears well-developed and well-nourished. No distress.  HENT:  Head: Normocephalic and atraumatic.  Mouth/Throat: Oropharynx is clear and moist.  Eyes: Conjunctivae and EOM are normal. Pupils are equal, round, and reactive to light.  Neck: Normal range of motion. Neck supple.  Cardiovascular: Normal rate, regular rhythm, normal heart sounds and intact distal pulses.  Exam reveals no gallop and no friction rub.   No murmur heard. Pulmonary/Chest: Effort normal and breath sounds normal. No respiratory distress.  Abdominal: Soft. Bowel sounds are normal. There is no tenderness. There is no rebound and no guarding.  Musculoskeletal: Normal range of motion. She exhibits no tenderness.  Neurological: She is alert and oriented to person, place, and time. No cranial nerve deficit.  Skin: Skin is warm and dry.    ED Course  Procedures (including critical care time)  Labs Reviewed  COMPREHENSIVE METABOLIC PANEL - Abnormal; Notable for the following:    Potassium 3.4 (*)    All other components within normal limits  CBC  DIFFERENTIAL  LIPASE, BLOOD  D-DIMER, QUANTITATIVE   No results  found.   1. Vasovagal syncope       MDM  Refused EKG. Right ear for workup was relatively unremarkable. She received a dose of IV fluids as well as some potassium for slightly low potassium. Symptoms were improved on reassessment. States she wishes to leave at this point prior to EKG in the  completion of her evaluation and she has to pick up her children. At this time I do not see any cause of concern for her syncope. I feel it was a vasovagal event she'll be safely discharged home with instructions to followup with her primary care physician and to drink plenty of oral fluid. A d-dimer was performed at the patient's request as she states she had an elevated 1 the past but did not receive CT. This was negative.        Dayton Bailiff, MD 01/28/11 [phone]

## 2011-08-19 ENCOUNTER — Emergency Department (HOSPITAL_COMMUNITY)
Admission: EM | Admit: 2011-08-19 | Discharge: 2011-08-19 | Disposition: A | Discharge status: Home / Self Care | Payer: Self-pay | Attending: Emergency Medicine | Admitting: Emergency Medicine

## 2011-08-19 ENCOUNTER — Emergency Department (HOSPITAL_COMMUNITY): Payer: Self-pay

## 2011-08-19 ENCOUNTER — Encounter (HOSPITAL_COMMUNITY): Payer: Self-pay | Admitting: *Deleted

## 2011-08-19 DIAGNOSIS — F17200 Nicotine dependence, unspecified, uncomplicated: Secondary | ICD-10-CM | POA: Insufficient documentation

## 2011-08-19 DIAGNOSIS — M79609 Pain in unspecified limb: Secondary | ICD-10-CM | POA: Insufficient documentation

## 2011-08-19 DIAGNOSIS — Z882 Allergy status to sulfonamides status: Secondary | ICD-10-CM | POA: Insufficient documentation

## 2011-08-19 DIAGNOSIS — M79673 Pain in unspecified foot: Secondary | ICD-10-CM

## 2011-08-19 DIAGNOSIS — Z880 Allergy status to penicillin: Secondary | ICD-10-CM | POA: Insufficient documentation

## 2011-08-19 MED ORDER — OXYCODONE-ACETAMINOPHEN 5-325 MG PO TABS: 1.0000 | ORAL_TABLET | ORAL | Status: AC | PRN

## 2011-08-19 MED ORDER — IBUPROFEN 600 MG PO TABS: 600.0000 mg | ORAL_TABLET | Freq: Four times a day (QID) | ORAL | Status: AC | PRN

## 2011-08-19 MED ORDER — OXYCODONE-ACETAMINOPHEN 5-325 MG PO TABS
1.0000 | ORAL_TABLET | Freq: Once | ORAL | Status: AC
  Administered 2011-08-19: 1 via ORAL
  Filled 2011-08-19: qty 1

## 2011-08-19 NOTE — ED Notes (Signed)
Patient reports she was fishing on Saturday,  She states the dock broke and she injured her left foot.  Patient has noted contusion to her foot/toes and has pain on the top of her foot

## 2011-08-19 NOTE — Progress Notes (Signed)
Orthopedic Tech Progress Note Patient Details:  Kaitlyn Vargas [birth year] [id number]  Ortho Devices Type of Ortho Device: Crutches Ortho Device/Splint Interventions: Application   Kaitlyn Vargas 08/19/2011, 2:36 PM

## 2011-08-19 NOTE — ED Provider Notes (Signed)
History   This chart was scribed for Forbes Cellar, MD by Toya Smothers. The patient was seen in room TR05C/TR05C. Patient's care was started at 1144.  CSN: [id number]  Arrival date & time 08/19/11  1144   First MD Initiated Contact with Patient 08/19/11 1400      Chief Complaint  Patient presents with  . Foot Pain   Patient is a 31 y.o. female presenting with lower extremity pain. The history is provided by the patient and the spouse. No language interpreter was used.  Foot Pain Pertinent negatives include no abdominal pain.    Kaitlyn Vargas is a 31 y.o. female who presents to the Emergency Department complaining of sudden onset moderate sever L foot pain onset 2 days ago as the result of injury while walking. Pt reports that she was walking on a dock and caught her flip flop between planks of wood causing her to trip and fold her foot over itself. Pt currently rates pain as 7/10 and describes it as numb and wet.   ED Notes, ED Provider Notes from 08/19/11 0000 to 08/19/11 12:10:20       Kristi Marquis Buggy, RN 08/19/2011 12:08      Patient reports she was fishing on Saturday, She states the dock broke and she injured her left foot. Patient has noted contusion to her foot/toes and has pain on the top of her foot         Past Medical History  Diagnosis Date  . ASD (atrial septal defect)     Past Surgical History  Procedure Date  . Tubal ligation     No family history on file.  History  Substance Use Topics  . Smoking status: Current Everyday Smoker  . Smokeless tobacco: Not on file  . Alcohol Use: Yes    Review of Systems  Gastrointestinal: Negative for nausea, vomiting, abdominal pain and diarrhea.  Musculoskeletal:       Foot pain.  Skin: Negative for rash.  Neurological: Positive for numbness.    Allergies  Penicillins and Sulfonamide derivatives  Home Medications   Current Outpatient Rx  Name Route Sig Dispense Refill  . ASPIRIN 81 MG PO CHEW Oral  Chew 81 mg by mouth daily.    . IBUPROFEN 600 MG PO TABS Oral Take 1 tablet (600 mg total) by mouth every 6 (six) hours as needed for pain. 30 tablet 0  . OXYCODONE-ACETAMINOPHEN 5-325 MG PO TABS Oral Take 1 tablet by mouth every 4 (four) hours as needed for pain. 15 tablet 0    BP 116/53  Pulse 69  Temp 97.7 F (36.5 C) (Oral)  Resp 16  SpO2 99%  LMP 07/24/2011  Physical Exam  Nursing note and vitals reviewed. Constitutional: She is oriented to person, place, and time. She appears well-developed and well-nourished. No distress.  HENT:  Head: Normocephalic and atraumatic.  Eyes: EOM are normal. Pupils are equal, round, and reactive to light.  Neck: Neck supple. No tracheal deviation present.  Cardiovascular: Normal rate and regular rhythm.  Exam reveals no gallop and no friction rub.   No murmur heard. Pulmonary/Chest: Effort normal and breath sounds normal. No respiratory distress. She has no wheezes. She has no rales.  Abdominal: Soft. She exhibits no distension and no mass.  Musculoskeletal: Normal range of motion. She exhibits no edema.       Feet:       No ankle tenderness. dp/dt intact. Able to wiggle toes. Cap fill less than 3  sec. Randalyn Rhea sensation intact. Sensation decreased in 4th and 5th toes. Diffuse tenderness to palpation in all toes except in great toe.  Neurological: She is alert and oriented to person, place, and time. No sensory deficit.  Skin: Skin is warm and dry.  Psychiatric: She has a normal mood and affect. Her behavior is normal.    ED Course  Procedures (including critical care time) DIAGNOSTIC STUDIES: Oxygen Saturation is 99% on room air, normal by my interpretation.    COORDINATION OF CARE: 1415- Will prescribe pain medication. Advise use of crunches and ice compress.  Labs Reviewed - No data to display Dg Foot Complete Left  08/19/2011  *RADIOLOGY REPORT*  Clinical Data: Injured 2 days ago, hyperflexion of left second through fifth toes, pain   LEFT FOOT - COMPLETE 3+ VIEW  Comparison: 09/28/2008  Findings: Osseous mineralization grossly normal. Joint spaces preserved. No acute fracture, dislocation, or bone destruction.  IMPRESSION: No acute osseous abnormalities.  Original Report Authenticated By: Lollie Marrow, M.D.   1. Foot pain    MDM  Foot injury without open wound or fracture. Pain control, RICE. No EMC precluding discharge at this time. Given Precautions for return. PMD f/u.  I personally performed the services described in this documentation, which was scribed in my presence. The recorded information has been reviewed and considered.      Forbes Cellar, MD 08/19/11 1455

## 2011-08-19 NOTE — Discharge Instructions (Signed)
Contusion A contusion is a deep bruise. Contusions are the result of an injury that caused bleeding under the skin. The contusion may turn blue, purple, or yellow. Minor injuries will give you a painless contusion, but more severe contusions may stay painful and swollen for a few weeks.  CAUSES  A contusion is usually caused by a blow, trauma, or direct force to an area of the body. SYMPTOMS   Swelling and redness of the injured area.   Bruising of the injured area.   Tenderness and soreness of the injured area.   Pain.  DIAGNOSIS  The diagnosis can be made by taking a history and physical exam. An X-ray, CT scan, or MRI may be needed to determine if there were any associated injuries, such as fractures. TREATMENT  Specific treatment will depend on what area of the body was injured. In general, the best treatment for a contusion is resting, icing, elevating, and applying cold compresses to the injured area. Over-the-counter medicines may also be recommended for pain control. Ask your caregiver what the best treatment is for your contusion. HOME CARE INSTRUCTIONS   Put ice on the injured area.   Put ice in a plastic bag.   Place a towel between your skin and the bag.   Leave the ice on for 15 to 20 minutes, 3 to 4 times a day.   Only take over-the-counter or prescription medicines for pain, discomfort, or fever as directed by your caregiver. Your caregiver may recommend avoiding anti-inflammatory medicines (aspirin, ibuprofen, and naproxen) for 48 hours because these medicines may increase bruising.   Rest the injured area.   If possible, elevate the injured area to reduce swelling.  SEEK IMMEDIATE MEDICAL CARE IF:   You have increased bruising or swelling.   You have pain that is getting worse.   Your swelling or pain is not relieved with medicines.  MAKE SURE YOU:   Understand these instructions.   Will watch your condition.   Will get help right away if you are not  doing well or get worse.  Document Released: 11/21/2004 Document Revised: 01/31/2011 Document Reviewed: 12/17/2010 [facility] Patient Information 2012 [place], [place].  RESOURCE GUIDE  Dental Problems  Patients with Medicaid: [facility] [phone] [address]                                           [phone] [address] Phone:  [phone]                                                   Phone:  [phone]  If unable to pay or uninsured, contact:  [facility] or [facility]. to become qualified for the adult dental clinic.  Chronic Pain Problems Contact [facility]  [phone] Patients need to be referred by their primary care doctor.  Insufficient Money for Medicine Contact [employer]:  call "211" or [facility] [phone].  No Primary Care Doctor Call [facility]  [phone] Other agencies that provide inexpensive medical care    [facility]  [phone]    [facility]  [phone]    [facility]  [phone]    [facility]  [phone]    [facility]  [phone]    [facility]  [phone]  Psychological Services [facility]  [phone] [facility]  [phone] [facility]   [phone] (emergency services [phone])  Abuse/Neglect [facility] Child Abuse Hotline 4345393006 [facility] Child Abuse Hotline 365-763-3228 (After Hours)  Emergency Shelter [facility] [phone]  Maternity Homes [facility] at the [facility] [phone] [facility] [phone]  MRSA Hotline #:   856 284 9712    [facility] Resources  [facility]  [employer]                           [facility] Dept. [address] [place]                     [address]         [address] [place] [address]  Blondell Reveal Phone:  [phone]                                  Phone:  [phone]                   Phone:  [phone]  [facility] Phone:  [phone]  [facility] Child Abuse Hotline (601)192-6658 [phone] (After Hours)

## 2011-11-06 ENCOUNTER — Emergency Department (HOSPITAL_COMMUNITY)
Admission: EM | Admit: 2011-11-06 | Discharge: 2011-11-06 | Disposition: A | Discharge status: Home / Self Care | Payer: Self-pay | Attending: Emergency Medicine | Admitting: Emergency Medicine

## 2011-11-06 ENCOUNTER — Encounter (HOSPITAL_COMMUNITY): Payer: Self-pay | Admitting: Emergency Medicine

## 2011-11-06 DIAGNOSIS — Z880 Allergy status to penicillin: Secondary | ICD-10-CM | POA: Insufficient documentation

## 2011-11-06 DIAGNOSIS — K529 Noninfective gastroenteritis and colitis, unspecified: Secondary | ICD-10-CM

## 2011-11-06 DIAGNOSIS — F17200 Nicotine dependence, unspecified, uncomplicated: Secondary | ICD-10-CM | POA: Insufficient documentation

## 2011-11-06 DIAGNOSIS — Z882 Allergy status to sulfonamides status: Secondary | ICD-10-CM | POA: Insufficient documentation

## 2011-11-06 DIAGNOSIS — K5289 Other specified noninfective gastroenteritis and colitis: Secondary | ICD-10-CM | POA: Insufficient documentation

## 2011-11-06 LAB — BASIC METABOLIC PANEL
BUN: 8 mg/dL (ref 6–23)
Calcium: 9.4 mg/dL (ref 8.4–10.5)
GFR calc non Af Amer: >90 mL/min (ref >90)
Glucose, Bld: 115 mg/dL — ABNORMAL HIGH (ref 70–99)
Potassium: 3 mEq/L — ABNORMAL LOW (ref 3.5–5.1)

## 2011-11-06 LAB — URINALYSIS, ROUTINE W REFLEX MICROSCOPIC
Ketones, ur: 15 mg/dL — AB
Nitrite: NEGATIVE
Urobilinogen, UA: 1 mg/dL (ref 0.0–1.0)

## 2011-11-06 LAB — URINE MICROSCOPIC-ADD ON

## 2011-11-06 MED ORDER — ONDANSETRON HCL 4 MG PO TABS: 4.0000 mg | ORAL_TABLET | Freq: Four times a day (QID) | ORAL | Status: AC

## 2011-11-06 MED ORDER — PROMETHAZINE HCL 25 MG RE SUPP: 25.0000 mg | Freq: Four times a day (QID) | RECTAL | Status: DC | PRN

## 2011-11-06 MED ORDER — METOCLOPRAMIDE HCL 5 MG/ML IJ SOLN
10.0000 mg | Freq: Once | INTRAMUSCULAR | Status: AC
  Administered 2011-11-06: 10 mg via INTRAVENOUS
  Filled 2011-11-06: qty 2

## 2011-11-06 MED ORDER — SODIUM CHLORIDE 0.9 % IV BOLUS (SEPSIS)
1000.0000 mL | Freq: Once | INTRAVENOUS | Status: AC
  Administered 2011-11-06: 1000 mL via INTRAVENOUS

## 2011-11-06 MED ORDER — LOPERAMIDE HCL 2 MG PO CAPS: 2.0000 mg | ORAL_CAPSULE | Freq: Four times a day (QID) | ORAL | Status: AC | PRN

## 2011-11-06 NOTE — ED Notes (Signed)
Has had vomiting and diarrhea x 4 days starting after eating something. Concerned it is food poisoning. No fever but has been in bed with chills

## 2011-11-06 NOTE — ED Provider Notes (Signed)
History     CSN: [id number]  Arrival date & time 11/06/11  4098   First MD Initiated Contact with Patient 11/06/11 0749      Chief Complaint  Patient presents with  . Emesis  . Diarrhea    (Consider location/radiation/quality/duration/timing/severity/associated sxs/prior treatment) HPI  31 year old female in no acute distress complaining of watery diarrhea and NBNB vomiting x4 days with no improvement. Associated with chills and myalgia. Denies abdominal pain, fever, dark or bloody stool, rash or tick exposure.   Past Medical History  Diagnosis Date  . ASD (atrial septal defect)     Past Surgical History  Procedure Date  . Tubal ligation     History reviewed. No pertinent family history.  History  Substance Use Topics  . Smoking status: Current Every Day Smoker  . Smokeless tobacco: Not on file  . Alcohol Use: Yes    OB History    Grav Para Term Preterm Abortions TAB SAB Ect Mult Living                  Review of Systems  Constitutional: Negative for fever.  Respiratory: Negative for shortness of breath.   Cardiovascular: Negative for chest pain.  Gastrointestinal: Positive for nausea, vomiting and diarrhea. Negative for abdominal pain.  Musculoskeletal: Positive for myalgias.  All other systems reviewed and are negative.    Allergies  Penicillins and Sulfonamide derivatives  Home Medications   Current Outpatient Rx  Name Route Sig Dispense Refill  . ASPIRIN EFFERVESCENT 325 MG PO TBEF Oral Take 325 mg by mouth every 6 (six) hours as needed. For cold symptoms      BP 101/49  Pulse 99  Temp 98.7 F (37.1 C) (Rectal)  Resp 18  SpO2 99%  LMP 10/10/2011  Physical Exam  Nursing note and vitals reviewed. Constitutional: She is oriented to person, place, and time. She appears well-developed and well-nourished. No distress.  HENT:  Head: Normocephalic.  Eyes: Conjunctivae normal and EOM are normal.  Cardiovascular: Normal rate, regular rhythm and  normal heart sounds.   Pulmonary/Chest: Effort normal and breath sounds normal. No stridor. No respiratory distress. She has no wheezes. She has no rales. She exhibits no tenderness.  Abdominal: Soft. Bowel sounds are normal. She exhibits no distension and no mass. There is no tenderness. There is no rebound and no guarding.  Musculoskeletal: Normal range of motion.  Neurological: She is alert and oriented to person, place, and time.  Psychiatric: She has a normal mood and affect.    ED Course  Procedures (including critical care time)  Labs Reviewed  URINALYSIS, ROUTINE W REFLEX MICROSCOPIC - Abnormal; Notable for the following:    Color, Urine AMBER (*)  BIOCHEMICALS MAY BE AFFECTED BY COLOR   APPearance CLOUDY (*)     Hgb urine dipstick MODERATE (*)     Bilirubin Urine SMALL (*)     Ketones, ur 15 (*)     All other components within normal limits  BASIC METABOLIC PANEL - Abnormal; Notable for the following:    Potassium 3.0 (*)     Glucose, Bld 115 (*)     All other components within normal limits  PREGNANCY, URINE  URINE MICROSCOPIC-ADD ON   No results found.   1. Gastroenteritis       MDM  31 y.o. female with 3 days of NVD, no fever or abdominal pain. Pt rehydrated, tolerating PO at d/c.  Pt verbalized understanding and agrees with care plan. Outpatient follow-up and  return precautions given.    New Prescriptions   LOPERAMIDE (IMODIUM) 2 MG CAPSULE    Take 1 capsule (2 mg total) by mouth 4 (four) times daily as needed for diarrhea or loose stools.   ONDANSETRON (ZOFRAN) 4 MG TABLET    Take 1 tablet (4 mg total) by mouth every 6 (six) hours.   PROMETHAZINE (PHENERGAN) 25 MG SUPPOSITORY    Place 1 suppository (25 mg total) rectally every 6 (six) hours as needed for nausea.          Wynetta Emery, PA-C 11/06/11 1616

## 2011-11-06 NOTE — ED Provider Notes (Signed)
Medical screening examination/treatment/procedure(s) were performed by non-physician practitioner and as supervising physician I was immediately available for consultation/collaboration.  Glynn Octave, MD 11/06/11 1744

## 2011-11-06 NOTE — ED Notes (Signed)
Sipping on gingerale.

## 2012-06-29 ENCOUNTER — Encounter: Payer: Self-pay | Admitting: *Deleted

## 2016-02-28 ENCOUNTER — Encounter: Payer: Self-pay | Admitting: *Deleted

## 2016-02-29 ENCOUNTER — Ambulatory Visit (INDEPENDENT_AMBULATORY_CARE_PROVIDER_SITE_OTHER): Payer: Medicaid Other | Admitting: Cardiology

## 2016-02-29 ENCOUNTER — Encounter: Payer: Self-pay | Admitting: Cardiology

## 2016-02-29 VITALS — BP 100/72 | HR 68 | Ht 64.0 in | Wt 117.0 lb

## 2016-02-29 DIAGNOSIS — Z720 Tobacco use: Secondary | ICD-10-CM

## 2016-02-29 DIAGNOSIS — R0789 Other chest pain: Secondary | ICD-10-CM | POA: Diagnosis not present

## 2016-02-29 DIAGNOSIS — R002 Palpitations: Secondary | ICD-10-CM

## 2016-02-29 NOTE — Progress Notes (Signed)
Cardiology Office Note  Date: 02/29/2016   ID: Kaitlyn Vargas, DOB 1980-03-01, MRN [id number]  PCP: [facility]  Consulting Cardiologist: Nona Dell, MD   Chief Complaint  Patient presents with  . Palpitations    History of Present Illness: Kaitlyn Vargas is a 36 y.o. female referred for cardiology consultation by Ms. Kaitlyn Busman NP at the [facility]. Limited records are available. She was seen by Dr. Daleen Squibb back in 2011 with a reported history of "hole in the heart" that closed spontaneously, possibly VSD although detailed information is not available. She underwent an echocardiogram in 2011 that did not show evidence of ASD or VSD (phone note in Epic indicates results given to patient November 2011).  She presents stating that she anticipates undergoing oral surgery and was concerned that she wanted to get her heart checked out first. She reports a fairly long-standing history of palpitations, describes this occurring at least once a week, feels a skip or fluttering sensation. Also feels a area of sharp chest discomfort sometimes with these symptoms like a thumb pressing on her chest. She has no associated syncope.  I reviewed her ECG today which shows sinus rhythm with R prime in lead V1, rightward axis.  States that she has had trouble with elevated blood pressure with previous pregnancies. Currently not on any antihypertensives.  Past Medical History:  Diagnosis Date  . Palpitations   . VSD (ventricular septal defect)    Possible history with spontaneous closure    Past Surgical History:  Procedure Laterality Date  . TUBAL LIGATION      Current Outpatient Prescriptions  Medication Sig Dispense Refill  . aspirin EC 81 MG tablet Take 81 mg by mouth daily.    Marland Kitchen aspirin-sod bicarb-citric acid (ALKA-SELTZER) 325 MG TBEF Take 325 mg by mouth every 6 (six) hours as needed. For cold symptoms    . promethazine (PHENERGAN) 25 MG suppository Place 1 suppository (25 mg  total) rectally every 6 (six) hours as needed for nausea. 6 each 0   No current facility-administered medications for this visit.    Allergies:  Penicillins and Sulfonamide derivatives   Social History: The patient  reports that she has been smoking.  She has never used smokeless tobacco. She reports that she drinks alcohol. She reports that she does not use drugs.   Family History: The patient's family history is not on file.   ROS:  Please see the history of present illness. Otherwise, complete review of systems is positive for none.  All other systems are reviewed and negative.   Physical Exam: VS:  BP 100/72   Pulse 68   Ht 5\' 4"  (1.626 m)   Wt 117 lb (53.1 kg)   SpO2 99%   BMI 20.08 kg/m , BMI Body mass index is 20.08 kg/m.  Wt Readings from Last 3 Encounters:  02/29/16 117 lb (53.1 kg)  02/28/16 117 lb (53.1 kg)  01/11/10 135 lb 1.9 oz (61.3 kg)    General: Young woman, appears comfortable at rest. HEENT: Conjunctiva and lids normal, oropharynx clear. Neck: Supple, no elevated JVP or carotid bruits, no thyromegaly. Lungs: Clear to auscultation, nonlabored breathing at rest. Cardiac: Regular rate and rhythm, no S3 or significant systolic murmur, no pericardial rub. Abdomen: Soft, bowel sounds present. Extremities: No pitting edema, distal pulses 2+. Skin: Warm and dry. Musculoskeletal: No kyphosis. Neuropsychiatric: Alert and oriented x3, affect grossly appropriate.  ECG: I personally reviewed the tracing from 01/31/2016 which shows normal  sinus rhythm.  Recent Labwork:  September 2013: Potassium 3.0, BUN 8, creatinine 0.6  Other Studies Reviewed Today:  Echocardiogram 01/24/2010: Study Conclusions  - Left ventricle: The cavity size was normal. Wall thickness was  normal. Systolic function was normal. The estimated ejection  fraction was 55%. Wall motion was normal; there were no regional  wall motion abnormalities. Left ventricular diastolic  function  parameters were normal. - Aortic valve: There was no stenosis. - Mitral valve: Trivial regurgitation. - Right ventricle: The cavity size was normal. Systolic function was  normal. - Pulmonary arteries: No complete TR doppler jet so unable to  estimate PA systolic pressure. - Inferior vena cava: The vessel was normal in size; the  respirophasic diameter changes were in the normal range (= 50%);  findings are consistent with normal central venous pressure.  Impressions:  - No evidence for residual ASD or VSD. Normal RV and LV size and  systolic function.  Assessment and Plan:  1. Palpitations and atypical chest discomfort as outlined. No associated syncope. ECG reviewed and nonspecific. She reports a fairly long-standing history. She mentions "PVCs" being documented in the past. We will obtain a seven-day cardiac monitor for further evaluation.  2. Reported history of "hole in the heart" that spontaneously closed, presumably VSD although definitive information is not available. Her last echocardiogram was in 2011 and did not demonstrate any obvious ASD or VSD. In light of her symptoms of palpitations and atypical chest pain, a follow-up echocardiogram will also be obtained.  3. Tobacco abuse.  4. Reported history of elevated blood pressure with previous pregnancies. Not on antihypertensives. Blood pressure is normal at this time.  Current medicines were reviewed with the patient today.   Orders Placed This Encounter  Procedures  . Cardiac event monitor  . EKG 12-Lead  . ECHOCARDIOGRAM COMPLETE    Disposition: Call with test results.  Signed, Jonelle SidleSamuel G. McDowell, MD, [facility] Hospital[facility]ACC 02/29/2016 10:43 AM    [facility] [address], [place], [place] [address] Phone: [phone]; Fax: [phone]

## 2016-02-29 NOTE — Patient Instructions (Addendum)
Medication Instructions:  Continue all current medications.  Labwork: none  Testing/Procedures:  Your physician has requested that you have an echocardiogram. Echocardiography is a painless test that uses sound waves to create images of your heart. It provides your doctor with information about the size and shape of your heart and how well your heart's chambers and valves are working. This procedure takes approximately one hour. There are no restrictions for this procedure.  Your physician has recommended that you wear a 7 day event monitor. Event monitors are medical devices that record the heart's electrical activity. Doctors most often us these monitors to diagnose arrhythmias. Arrhythmias are problems with the speed or rhythm of the heartbeat. The monitor is a small, portable device. You can wear one while you do your normal daily activities. This is usually used to diagnose what is causing palpitations/syncope (passing out).  Office will contact with results via phone or letter.    Follow-Up: To be determined.   Any Other Special Instructions Will Be Listed Below (If Applicable).  If you need a refill on your cardiac medications before your next appointment, please call your pharmacy.

## 2016-03-11 ENCOUNTER — Ambulatory Visit (INDEPENDENT_AMBULATORY_CARE_PROVIDER_SITE_OTHER): Payer: Medicaid Other

## 2016-03-11 DIAGNOSIS — R002 Palpitations: Secondary | ICD-10-CM | POA: Diagnosis not present

## 2016-03-20 ENCOUNTER — Other Ambulatory Visit: Payer: Self-pay

## 2016-03-20 ENCOUNTER — Ambulatory Visit (INDEPENDENT_AMBULATORY_CARE_PROVIDER_SITE_OTHER): Payer: Medicaid Other

## 2016-03-20 DIAGNOSIS — R0789 Other chest pain: Secondary | ICD-10-CM | POA: Diagnosis not present

## 2016-03-21 ENCOUNTER — Telehealth: Payer: Self-pay | Admitting: *Deleted

## 2016-03-21 NOTE — Telephone Encounter (Signed)
-----   Message from Jonelle SidleSamuel G McDowell, MD sent at 03/20/2016  1:48 PM EST ----- Results reviewed. No obvious residual VSD or ASD by report. 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

## 2016-03-21 NOTE — Telephone Encounter (Signed)
Patient informed and copy sent to PCP. 

## 2016-03-21 NOTE — Telephone Encounter (Signed)
Returned call

## 2016-03-25 ENCOUNTER — Telehealth: Payer: Self-pay | Admitting: *Deleted

## 2016-03-25 NOTE — Telephone Encounter (Signed)
Patient informed and verbalized understanding

## 2016-03-25 NOTE — Telephone Encounter (Signed)
Patient informed and copy sent to PCP. 

## 2016-03-25 NOTE — Telephone Encounter (Signed)
-----   Message from Jonelle SidleSamuel G McDowell, MD sent at 03/25/2016  4:50 PM EST ----- Regarding: RE: CLEARANCE FOR ORAL SURGERY Yes she may proceed with oral surgery from a cardiac perspective. Does not require antibiotics.  ----- Message ----- From: Eustace MooreLydia M Daemion Mcniel, LPN Sent: 4/13/24401/29/2018   4:35 PM To: Jonelle SidleSamuel G McDowell, MD Subject: CLEARANCE FOR ORAL SURGERY                     Hey Dr. Diona BrownerMcDowell. This patient is wanting to know if she is cleared to have her oral surgery? I saw that you mentioned the oral surgery in her initial visit.

## 2016-03-25 NOTE — Telephone Encounter (Signed)
-----   Message from Jonelle SidleSamuel G McDowell, MD sent at 03/25/2016  9:48 AM EST ----- Results reviewed. No significant arrhythmias were noted, also no PVCs. It does not look like we need to start a specific medication at this time for her history of palpitations. No further workup planned at this time unless symptoms progress. Keep follow-up with PCP. [facility] copy of this test [facility]

## 2016-04-24 ENCOUNTER — Telehealth: Payer: Self-pay | Admitting: Cardiology

## 2016-04-24 NOTE — Telephone Encounter (Signed)
Previous phone note giving clearance faxed to Dr Barbette MerinoJensen as requested

## 2016-04-24 NOTE — Telephone Encounter (Signed)
Dr Barbette MerinoJensen [phone] Please fax dental surgery clearance

## 2017-04-07 ENCOUNTER — Ambulatory Visit (INDEPENDENT_AMBULATORY_CARE_PROVIDER_SITE_OTHER): Payer: Self-pay | Admitting: Otolaryngology

## 2017-05-12 ENCOUNTER — Ambulatory Visit (INDEPENDENT_AMBULATORY_CARE_PROVIDER_SITE_OTHER): Payer: Medicaid Other | Admitting: Otolaryngology

## 2019-11-18 ENCOUNTER — Emergency Department (HOSPITAL_COMMUNITY)
Admission: EM | Admit: 2019-11-18 | Discharge: 2019-11-18 | Disposition: A | Discharge status: Home / Self Care | Payer: Medicaid Other | Attending: Emergency Medicine | Admitting: Emergency Medicine

## 2019-11-18 ENCOUNTER — Emergency Department (HOSPITAL_COMMUNITY): Payer: Medicaid Other

## 2019-11-18 ENCOUNTER — Encounter (HOSPITAL_COMMUNITY): Payer: Self-pay

## 2019-11-18 DIAGNOSIS — F17200 Nicotine dependence, unspecified, uncomplicated: Secondary | ICD-10-CM | POA: Diagnosis not present

## 2019-11-18 DIAGNOSIS — F410 Panic disorder [episodic paroxysmal anxiety] without agoraphobia: Secondary | ICD-10-CM | POA: Diagnosis not present

## 2019-11-18 DIAGNOSIS — R002 Palpitations: Secondary | ICD-10-CM | POA: Insufficient documentation

## 2019-11-18 LAB — CBC WITH DIFFERENTIAL/PLATELET
Abs Immature Granulocytes: 0.03 10*3/uL (ref 0.00–0.07)
Basophils Absolute: 0 10*3/uL (ref 0.0–0.1)
Basophils Relative: 0 %
Eosinophils Absolute: 0 10*3/uL (ref 0.0–0.5)
Eosinophils Relative: 0 %
HCT: 38.5 % (ref 36.0–46.0)
Hemoglobin: 13.2 g/dL (ref 12.0–15.0)
Immature Granulocytes: 0 %
Lymphocytes Relative: 24 %
Lymphs Abs: 1.9 10*3/uL (ref 0.7–4.0)
MCH: 29.5 pg (ref 26.0–34.0)
MCHC: 34.3 g/dL (ref 30.0–36.0)
MCV: 86.1 fL (ref 80.0–100.0)
Monocytes Absolute: 0.5 10*3/uL (ref 0.1–1.0)
Monocytes Relative: 7 %
Neutro Abs: 5.4 10*3/uL (ref 1.7–7.7)
Neutrophils Relative %: 69 %
Platelets: 227 10*3/uL (ref 150–400)
RBC: 4.47 MIL/uL (ref 3.87–5.11)
RDW: 11.8 % (ref 11.5–15.5)
WBC: 8 10*3/uL (ref 4.0–10.5)
nRBC: 0 % (ref 0.0–0.2)

## 2019-11-18 LAB — I-STAT BETA HCG BLOOD, ED (MC, WL, AP ONLY): I-stat hCG, quantitative: <5 m[IU]/mL (ref <5)

## 2019-11-18 LAB — BASIC METABOLIC PANEL
Anion gap: 13 (ref 5–15)
BUN: 9 mg/dL (ref 6–20)
CO2: 23 mmol/L (ref 22–32)
Calcium: 9.2 mg/dL (ref 8.9–10.3)
Chloride: 104 mmol/L (ref 98–111)
Creatinine, Ser: 0.58 mg/dL (ref 0.44–1.00)
GFR calc Af Amer: >60 mL/min (ref >60)
GFR calc non Af Amer: >60 mL/min (ref >60)
Glucose, Bld: 137 mg/dL — ABNORMAL HIGH (ref 70–99)
Potassium: 3.1 mmol/L — ABNORMAL LOW (ref 3.5–5.1)
Sodium: 140 mmol/L (ref 135–145)

## 2019-11-18 MED ORDER — POTASSIUM CHLORIDE CRYS ER 20 MEQ PO TBCR
40.0000 meq | EXTENDED_RELEASE_TABLET | Freq: Once | ORAL | Status: AC
  Administered 2019-11-18: 40 meq via ORAL
  Filled 2019-11-18: qty 2

## 2019-11-18 MED ORDER — HYDROXYZINE HCL 25 MG PO TABS: 25.0000 mg | ORAL_TABLET | Freq: Three times a day (TID) | ORAL | 0 refills | Status: AC | PRN

## 2019-11-18 MED ORDER — LORAZEPAM 2 MG/ML IJ SOLN
1.0000 mg | Freq: Once | INTRAMUSCULAR | Status: AC
  Administered 2019-11-18: 1 mg via INTRAVENOUS
  Filled 2019-11-18: qty 1

## 2019-11-18 MED ORDER — HYDROXYZINE HCL 25 MG PO TABS: 25.0000 mg | ORAL_TABLET | Freq: Four times a day (QID) | ORAL | 0 refills | Status: DC

## 2019-11-18 MED ORDER — POTASSIUM CHLORIDE ER 10 MEQ PO TBCR: 20.0000 meq | EXTENDED_RELEASE_TABLET | Freq: Every day | ORAL | 0 refills | Status: DC

## 2019-11-18 NOTE — ED Notes (Signed)
Pt tested positive for COVID and has been quartined since 9/14 along with her children.

## 2019-11-18 NOTE — ED Provider Notes (Signed)
[facility]-EMERGENCY DEPT Provider Note   CSN: [id number] Arrival date & time: 11/18/19  [id number]     History Chief Complaint  Patient presents with  . Palpitations    Kaitlyn Vargas is a 39 y.o. female with pertinent past medical history of palpitations and panic attack that presents emergency department today for palpitations.  Patient states that she has bad anxiety, was recently seen for palpitations due to anxiety, not showing up in our medical records.  States that she has been having recurrent panic attacks recently.  Is not on any medication for this.  States that palpitations occur for 10 minutes, present with dizziness.  Does also feel slightly nauseous when this happened, did vomit this morning.  No hematemesis or coffee-ground emesis.  No diarrhea.  No room spinning sensation.  Patient states that she had episode last night, lasted for 10 minutes.  Went to sleep and started again this morning.  No true chest pain.  Was recently diagnosed with Covid, on day 9.  No shortness of breath.  Does not have any true triggers, is having stress about Covid and her children having Covid right now.  Has never been diagnosed with asthma.  States that palpitations and dizziness stop after 10 minutes without intervention.  Does not have a psychiatrist.  Denies any fevers, chills, headache, vision changes, neck pain.  States that this feels similar to her previous panic attacks, however is very anxious so she wanted to come to the emergency department.  No SI, HI or hallucinations.  No syncope, numbness, tingling.  No confusion.  Denies any alcohol use, IV drug use, tobacco use.  Patient states that she does occasionally smoke marijuana, last use was over 2 weeks ago.  Not currently having symptoms.  Last attack when she presented to the ER.  HPI     Past Medical History:  Diagnosis Date  . Palpitations   . VSD (ventricular septal defect)    Possible history with spontaneous  closure    Patient Active Problem List   Diagnosis Date Noted  . ORTHOSTATIC HYPOTENSION 01/11/2010  . 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 PREMATURE VENTRICULAR CONTRACTIONS 01/09/2010  . HYPERTENSION, GESTATIONAL 01/09/2010  . SYNCOPE 01/09/2010  . HEADACHE 01/09/2010  . PAP SMEAR, ABNORMAL 01/09/2010    Past Surgical History:  Procedure Laterality Date  . TUBAL LIGATION       OB History   No obstetric history on file.     History reviewed. No pertinent family history.  Social History   Tobacco Use  . Smoking status: Current Every Day Smoker  . Smokeless tobacco: Never Used  Substance Use Topics  . Alcohol use: Yes  . Drug use: No    Home Medications Prior to Admission medications   Medication Sig Start Date End Date Taking? Authorizing Provider  ibuprofen (ADVIL) 200 MG tablet Take 600 mg by mouth every 6 (six) hours as needed for fever, headache or mild pain.   Yes [provider]  Multiple Vitamin (MULTIVITAMIN WITH MINERALS) TABS tablet Take 1 tablet by mouth daily.   Yes [provider]  oxymetazoline (AFRIN) 0.05 % nasal spray Place 1-2 sprays into both nostrils 2 (two) times daily as needed for congestion.   Yes [provider]  promethazine (PHENERGAN) 25 MG suppository Place 1 suppository (25 mg total) rectally every 6 (six) hours as needed for nausea. Patient not taking: Reported on 11/18/2019 11/06/11 11/13/11  Pisciotta, Joni ReiningNicole, PA-C    Allergies  Penicillins, Sulfonamide derivatives, and Amoxicillin  Review of Systems   Review of Systems  Constitutional: Negative for chills, diaphoresis, fatigue and fever.  HENT: Negative for congestion, sore throat and trouble swallowing.   Eyes: Negative for pain and visual disturbance.  Respiratory: Negative for cough, shortness of breath and wheezing.   Cardiovascular: Positive for palpitations. Negative for chest pain and leg swelling.  Gastrointestinal: Positive for nausea and  vomiting. Negative for abdominal distention, abdominal pain and diarrhea.  Genitourinary: Negative for difficulty urinating.  Musculoskeletal: Negative for back pain, neck pain and neck stiffness.  Skin: Negative for pallor.  Neurological: Positive for dizziness and light-headedness. Negative for tremors, seizures, syncope, speech difficulty, weakness, numbness and headaches.  Psychiatric/Behavioral: Negative for confusion.    Physical Exam Updated Vital Signs BP 116/63 (BP Location: Left Arm)   Pulse 95   Temp 98.3 F (36.8 C) (Oral)   Resp 14   LMP 11/01/2019 (Approximate)   SpO2 98%   Physical Exam Constitutional:      General: She is not in acute distress.    Appearance: Normal appearance. She is not ill-appearing, toxic-appearing or diaphoretic.  HENT:     Head: Normocephalic and atraumatic.     Mouth/Throat:     Mouth: Mucous membranes are moist.     Pharynx: Oropharynx is clear.  Eyes:     General: No scleral icterus.    Extraocular Movements: Extraocular movements intact.     Right eye: Normal extraocular motion and no nystagmus.     Left eye: Normal extraocular motion and no nystagmus.     Pupils: Pupils are equal, round, and reactive to light.  Cardiovascular:     Rate and Rhythm: Normal rate and regular rhythm.     Pulses: Normal pulses.     Heart sounds: Normal heart sounds.  Pulmonary:     Effort: Pulmonary effort is normal. No respiratory distress.     Breath sounds: Normal breath sounds. No stridor. No wheezing, rhonchi or rales.  Chest:     Chest wall: No tenderness.  Abdominal:     General: Abdomen is flat. There is no distension.     Palpations: Abdomen is soft.     Tenderness: There is no abdominal tenderness. There is no guarding or rebound.  Musculoskeletal:        General: No swelling or tenderness. Normal range of motion.     Cervical back: Normal range of motion and neck supple. No rigidity.     Right lower leg: No edema.     Left lower leg:  No edema.  Skin:    General: Skin is warm and dry.     Capillary Refill: Capillary refill takes less than 2 seconds.     Coloration: Skin is not pale.  Neurological:     General: No focal deficit present.     Mental Status: She is alert and oriented to person, place, and time.     Comments: Alert. Clear speech. No facial droop. CNIII-XII grossly intact. Bilateral upper and lower extremities' sensation grossly intact. 5/5 symmetric strength with grip strength and with plantar and dorsi flexion bilaterally.  Negative pronator drift.   Psychiatric:        Mood and Affect: Mood is anxious.        Behavior: Behavior normal. Behavior is cooperative.     ED Results / Procedures / Treatments   Labs (all labs ordered are listed, but only abnormal results are displayed) Labs Reviewed  CBC WITH DIFFERENTIAL/PLATELET  BASIC METABOLIC PANEL  I-STAT BETA HCG BLOOD, ED ([facility], [facility], [facility] ONLY)    EKG EKG Interpretation  Date/Time:  Thursday November 18 2019 09:10:05 EDT Ventricular Rate:  90 PR Interval:    QRS Duration: 90 QT Interval:  362 QTC Calculation: 443 R Axis:   87 Text Interpretation: Sinus rhythm Biatrial enlargement RSR' in V1 or V2, right VCD or RVH 12 Lead; Mason-Likar nonspecific ST/T changes present Confirmed by Pricilla Loveless [phone]) on 11/18/2019 1:02:14 PM   Radiology DG Chest Portable 1 View  Result Date: 11/18/2019 CLINICAL DATA:  Wheezing and cardiac palpitations EXAM: PORTABLE CHEST 1 VIEW COMPARISON:  Chest radiograph December 26, 2010 FINDINGS: The lungs are clear. The heart size and pulmonary vascularity are normal. No adenopathy. No pneumothorax. No bone lesions. IMPRESSION: Lungs clear.  Cardiac silhouette normal. Electronically Signed   By: Bretta Bang III M.D.   On: 11/18/2019 14:07    Procedures Procedures (including critical care time)  Medications Ordered in ED Medications  LORazepam (ATIVAN) injection 1 mg (1 mg Intravenous Given 11/18/19 1407)     ED Course  I have reviewed the triage vital signs and the nursing notes.  Pertinent labs & imaging results that were available during my care of the patient were reviewed by me and considered in my medical decision making (see chart for details).    MDM Rules/Calculators/A&P                          Lanelle Lindo Jalomo is a 39 y.o. female with pertinent past medical history of palpitations and panic attack that presents emergency department today for palpitations.  Patient presenting with symptoms consistent of panic attack, patient is not currently having symptoms currently however feels anxious about her symptoms. Last attack when she arrived in the ER, many hours ago.  Also feels slightly nauseous.  Normal neuro exam.  Patient states that the symptoms feel like previous panic attacks.  Will give Ativan get basic labs at this time and reevaluate.  Per chart review patient was seen in 2018 by cardiology for similar presentation did have Holter monitor and have echo.  Both normal.  Echo did not show ASD or VSD.  Patient's presentation most similar to panic attack, patient feel similar.  Work-up today with EKG interpreted me sinus rhythm, also interpreted by Dr. Rodena Medin.  Chest x-ray  interpretted by me without any acute cardiopulmonary disease.  Patient is not pregnant.  CBC and BMP in process.  Patient given Ativan.  Upon reassessment patient states that she feels much better with Ativan, not having any anxiety-like symptoms anymore.  BMP with potassium 3.1, did replete in the emergency department.  We will also give Atarax perscription at this time for anxiety, did go over this with patient.  Patient to follow-up with primary care, symptomatic treatment discussed.  Patient be discharged, patient agreeable with this.  Doubt need for further emergent work up at this time. I explained the diagnosis and have given explicit precautions to return to the ER including for any other new or worsening  symptoms. The patient understands and accepts the medical plan as it's been dictated and I have answered their questions. Discharge instructions concerning home care and prescriptions have been given. The patient is STABLE and is discharged to home in good condition.  I discussed this case with my attending physician who cosigned this note including patient's presenting symptoms, physical exam, and planned diagnostics and interventions. Attending physician  stated agreement with plan or made changes to plan which were implemented.  Final Clinical Impression(s) / ED Diagnoses Final diagnoses:  Heart palpitations  Panic attack    Rx / DC Orders ED Discharge Orders    None       Farrel Gordon, PA-C 11/18/19 1535    Wynetta Fines, MD 11/21/19 1702

## 2019-11-18 NOTE — ED Triage Notes (Signed)
Pt presents with c/o palpitations. Pt reports that she has been feeling the palpitations since last night. Pt reports hx of same. Pt is in NSR at this time, HR of 83.

## 2019-11-18 NOTE — Discharge Instructions (Addendum)
Your work-up today was reassuring, your potassium was slightly low.  I prescribed you potassium for the next 4 days.  Follow-up with your primary care, I want you to talk to them about starting you on medications for anxiety and low potassium since you state you have this current chronically.  I gave you a supply of Atarax, this can also help with your anxiety.  Please use the attached instructions.  Come back to the emergency department for any new or worsening concerning symptoms.  Please talk to the pharmacist about any new medication interactions or side effects of the medications given today.

## 2019-11-24 ENCOUNTER — Emergency Department (HOSPITAL_COMMUNITY)
Admission: EM | Admit: 2019-11-24 | Discharge: 2019-11-24 | Disposition: A | Discharge status: Home / Self Care | Payer: Medicaid Other | Attending: Emergency Medicine | Admitting: Emergency Medicine

## 2019-11-24 ENCOUNTER — Encounter (HOSPITAL_COMMUNITY): Payer: Self-pay

## 2019-11-24 ENCOUNTER — Other Ambulatory Visit: Payer: Self-pay

## 2019-11-24 ENCOUNTER — Encounter (HOSPITAL_COMMUNITY): Payer: Self-pay | Admitting: *Deleted

## 2019-11-24 ENCOUNTER — Emergency Department (HOSPITAL_COMMUNITY): Payer: Medicaid Other

## 2019-11-24 ENCOUNTER — Emergency Department (HOSPITAL_COMMUNITY)
Admission: EM | Admit: 2019-11-24 | Discharge: 2019-11-24 | Disposition: A | Discharge status: Home / Self Care | Payer: Medicaid Other | Source: Home / Self Care | Attending: Emergency Medicine | Admitting: Emergency Medicine

## 2019-11-24 DIAGNOSIS — F419 Anxiety disorder, unspecified: Secondary | ICD-10-CM

## 2019-11-24 DIAGNOSIS — F17200 Nicotine dependence, unspecified, uncomplicated: Secondary | ICD-10-CM | POA: Insufficient documentation

## 2019-11-24 DIAGNOSIS — R002 Palpitations: Secondary | ICD-10-CM | POA: Insufficient documentation

## 2019-11-24 DIAGNOSIS — R062 Wheezing: Secondary | ICD-10-CM

## 2019-11-24 DIAGNOSIS — F17210 Nicotine dependence, cigarettes, uncomplicated: Secondary | ICD-10-CM | POA: Insufficient documentation

## 2019-11-24 HISTORY — DX: Anxiety disorder, unspecified: F41.9

## 2019-11-24 LAB — CBC
HCT: 41.8 % (ref 36.0–46.0)
Hemoglobin: 14.2 g/dL (ref 12.0–15.0)
MCH: 29.7 pg (ref 26.0–34.0)
MCHC: 34 g/dL (ref 30.0–36.0)
MCV: 87.4 fL (ref 80.0–100.0)
Platelets: 405 10*3/uL — ABNORMAL HIGH (ref 150–400)
RBC: 4.78 MIL/uL (ref 3.87–5.11)
RDW: 12 % (ref 11.5–15.5)
WBC: 10.3 10*3/uL (ref 4.0–10.5)
nRBC: 0 % (ref 0.0–0.2)

## 2019-11-24 LAB — BASIC METABOLIC PANEL
Anion gap: 11 (ref 5–15)
BUN: 7 mg/dL (ref 6–20)
CO2: 21 mmol/L — ABNORMAL LOW (ref 22–32)
Calcium: 9.8 mg/dL (ref 8.9–10.3)
Chloride: 106 mmol/L (ref 98–111)
Creatinine, Ser: 0.63 mg/dL (ref 0.44–1.00)
GFR calc Af Amer: >60 mL/min (ref >60)
GFR calc non Af Amer: >60 mL/min (ref >60)
Glucose, Bld: 107 mg/dL — ABNORMAL HIGH (ref 70–99)
Potassium: 3.6 mmol/L (ref 3.5–5.1)
Sodium: 138 mmol/L (ref 135–145)

## 2019-11-24 LAB — I-STAT BETA HCG BLOOD, ED (MC, WL, AP ONLY): I-stat hCG, quantitative: 5.2 m[IU]/mL — ABNORMAL HIGH (ref <5)

## 2019-11-24 LAB — TROPONIN I (HIGH SENSITIVITY): Troponin I (High Sensitivity): 4 ng/L (ref <18)

## 2019-11-24 MED ORDER — LORAZEPAM 1 MG PO TABS
1.0000 mg | ORAL_TABLET | Freq: Once | ORAL | Status: AC
  Administered 2019-11-24: 1 mg via ORAL
  Filled 2019-11-24: qty 1

## 2019-11-24 MED ORDER — ALBUTEROL SULFATE HFA 108 (90 BASE) MCG/ACT IN AERS
2.0000 | INHALATION_SPRAY | Freq: Once | RESPIRATORY_TRACT | Status: AC
  Administered 2019-11-24: 2 via RESPIRATORY_TRACT
  Filled 2019-11-24: qty 6.7

## 2019-11-24 NOTE — ED Provider Notes (Signed)
[facility]-EMERGENCY DEPT Provider Note   CSN: [id number] Arrival date & time: 11/24/19  0973     History Chief Complaint  Patient presents with  . Anxiety  . Palpitations    Kaitlyn Vargas is a 39 y.o. female.  The history is provided by the patient.  Palpitations Palpitations quality:  Fast Onset quality:  Sudden Timing:  Intermittent Progression:  Waxing and waning Chronicity:  Recurrent Context: nicotine (stopped smoking 2-3 weeks ago)   Relieved by:  Nothing Worsened by:  Nothing Associated symptoms: no back pain, no chest pain, no cough, no shortness of breath and no vomiting        Past Medical History:  Diagnosis Date  . Anxiety   . Palpitations   . VSD (ventricular septal defect)    Possible history with spontaneous closure    Patient Active Problem List   Diagnosis Date Noted  . ORTHOSTATIC HYPOTENSION 01/11/2010  . 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 PREMATURE VENTRICULAR CONTRACTIONS 01/09/2010  . HYPERTENSION, GESTATIONAL 01/09/2010  . SYNCOPE 01/09/2010  . HEADACHE 01/09/2010  . PAP SMEAR, ABNORMAL 01/09/2010    Past Surgical History:  Procedure Laterality Date  . TUBAL LIGATION       OB History   No obstetric history on file.     No family history on file.  Social History   Tobacco Use  . Smoking status: Current Every Day Smoker  . Smokeless tobacco: Never Used  Substance Use Topics  . Alcohol use: Yes  . Drug use: No    Home Medications Prior to Admission medications   Medication Sig Start Date End Date Taking? Authorizing Provider  hydrOXYzine (ATARAX/VISTARIL) 25 MG tablet Take 1 tablet (25 mg total) by mouth every 8 (eight) hours as needed for up to 20 days. 11/18/19 12/08/19  Farrel Gordon, PA-C  ibuprofen (ADVIL) 200 MG tablet Take 600 mg by mouth every 6 (six) hours as needed for fever, headache or mild pain.    [provider]  Multiple Vitamin (MULTIVITAMIN WITH MINERALS) TABS tablet  Take 1 tablet by mouth daily.    [provider]  oxymetazoline (AFRIN) 0.05 % nasal spray Place 1-2 sprays into both nostrils 2 (two) times daily as needed for congestion.    [provider]  potassium chloride (KLOR-CON) 10 MEQ tablet Take 2 tablets (20 mEq total) by mouth daily for 4 days. 11/18/19 11/22/19  Farrel Gordon, PA-C  promethazine (PHENERGAN) 25 MG suppository Place 1 suppository (25 mg total) rectally every 6 (six) hours as needed for nausea. Patient not taking: Reported on 11/18/2019 11/06/11 11/13/11  Pisciotta, Joni Reining, PA-C    Allergies    Penicillins, Sulfonamide derivatives, and Amoxicillin  Review of Systems   Review of Systems  Constitutional: Negative for chills and fever.  HENT: Negative for ear pain and sore throat.   Eyes: Negative for pain and visual disturbance.  Respiratory: Negative for cough and shortness of breath.   Cardiovascular: Positive for palpitations. Negative for chest pain.  Gastrointestinal: Negative for abdominal pain and vomiting.  Genitourinary: Negative for dysuria and hematuria.  Musculoskeletal: Negative for arthralgias and back pain.  Skin: Negative for color change and rash.  Neurological: Negative for seizures and syncope.  Psychiatric/Behavioral: The patient is nervous/anxious.   All other systems reviewed and are negative.   Physical Exam Updated Vital Signs  ED Triage Vitals  Enc Vitals Group     BP 11/24/19 0910 111/88     Pulse Rate 11/24/19 0910 91  Resp 11/24/19 0910 (!) 22     Temp 11/24/19 0910 98.2 F (36.8 C)     Temp Source 11/24/19 0910 Oral     SpO2 11/24/19 0910 100 %     Weight 11/24/19 0912 145 lb (65.8 kg)     Height 11/24/19 0912 5\' 4"  (1.626 m)     Head Circumference --      Peak Flow --      Pain Score 11/24/19 0912 7     Pain Loc --      Pain Edu? --      Excl. in GC? --     Physical Exam Vitals and nursing note reviewed.  Constitutional:      General: She is not in acute  distress.    Appearance: She is well-developed. She is not ill-appearing.  HENT:     Head: Normocephalic and atraumatic.     Nose: Nose normal.     Mouth/Throat:     Mouth: Mucous membranes are moist.  Eyes:     Conjunctiva/sclera: Conjunctivae normal.     Pupils: Pupils are equal, round, and reactive to light.  Cardiovascular:     Rate and Rhythm: Normal rate and regular rhythm.     Pulses: Normal pulses.     Heart sounds: Normal heart sounds. No murmur heard.   Pulmonary:     Effort: Pulmonary effort is normal. No respiratory distress.     Breath sounds: Normal breath sounds.  Abdominal:     Palpations: Abdomen is soft.     Tenderness: There is no abdominal tenderness.  Musculoskeletal:     Cervical back: Normal range of motion and neck supple.  Skin:    General: Skin is warm and dry.     Capillary Refill: Capillary refill takes less than 2 seconds.  Neurological:     Mental Status: She is alert.  Psychiatric:     Comments: Slightly anxious     ED Results / Procedures / Treatments   Labs (all labs ordered are listed, but only abnormal results are displayed) Labs Reviewed - No data to display  EKG EKG Interpretation  Date/Time:  Wednesday November 24 2019 09:22:27 EDT Ventricular Rate:  88 PR Interval:    QRS Duration: 83 QT Interval:  364 QTC Calculation: 441 R Axis:   87 Text Interpretation: Sinus rhythm Confirmed by [id number]) on 11/24/2019 9:23:46 AM   Radiology No results found.  Procedures Procedures (including critical care time)  Medications Ordered in ED Medications  LORazepam (ATIVAN) tablet 1 mg (has no administration in time range)    ED Course  I have reviewed the triage vital signs and the nursing notes.  Pertinent labs & imaging results that were available during my care of the patient were reviewed by me and considered in my medical decision making (see chart for details).    MDM Rules/Calculators/A&P                           Kaitlyn Vargas is a 39 year old female who presents to the ED with palpitations.  Normal vitals.  No fever.  EKG shows sinus rhythm.  No ischemic changes.  Here for similar panic attack several days ago.  Had sudden nervousness, palpitations that time it continued overnight.  Has been under stress as multiple family members under quarantine for coronavirus.  Has not been out of the house in several days.  Suspect today's event anxiety driven.  Responded well to Ativan at last ED visit.  Will give an additional dose now.  We will have her continue Vistaril.  We will give her outpatient resources for mental health providers.  Does not currently have a PCP.  She would likely benefit from an antidepressant/antianxiety med.  No suicidal homicidal ideation.  Discharged in good condition.  No concern for cardiac or pulmonary process.  This chart was dictated using voice recognition software.  Despite best efforts to proofread,  errors can occur which can change the documentation meaning.    Final Clinical Impression(s) / ED Diagnoses Final diagnoses:  Anxiety  Palpitations    Rx / DC Orders ED Discharge Orders    None       Virgina Norfolk, DO 11/24/19 [id number]

## 2019-11-24 NOTE — ED Triage Notes (Signed)
Patient just discharge from [facility] ED and here requesting recheck of palpitations. Treated for anxiety. Tearful on arrival. No CP on assessment. Reports that the ativan is helping and appears in no distress, EKG repeated on arrival

## 2019-11-24 NOTE — Discharge Instructions (Addendum)
Please follow-up with mental health provider called [facility] or different numbers on your resource page and see if you can be evaluated by mental health provider to discuss anxiety and need for possible long-term medication.

## 2019-11-24 NOTE — ED Triage Notes (Signed)
Pt presents with extereme anxiety telling staff we aren't doing enough, seen here on 23rd for the same. Demanding EKG and something to help her. States she woke from her sleep with her heart pounding.

## 2019-11-24 NOTE — ED Provider Notes (Signed)
[facility] EMERGENCY DEPARTMENT Provider Note   CSN: [id number] Arrival date & time: 11/24/19  1027     History Chief Complaint  Patient presents with  . Anxiety    Kaitlyn Vargas is a 39 y.o. female.  Kaitlyn Vargas is a 39 y.o. female with history of palpitations, VSD and anxiety, who presents to the emergency department for evaluation of anxiety and palpitations.  She was seen at [facility] this morning for the same and had reassuring evaluation.  Patient states that she was given medication that seemed to help somewhat with her symptoms, she has been seen multiple times over the past week for similar anxiety and palpitations.  Patient states she has been evaluated for palpitations before back in 2018 when she was pregnant and was referred to a cardiologist and wore a monitoring patch but states that she never received the results.  She states she has had mild anxiety previously but it has been much more severe over the past week.  She states that she just recently got over coronavirus and has multiple other family members that are currently in quarantine for this and this has been contributing significantly to her anxiety, because she worries that the shortness of breath when she feels when she is anxious could be complications from Covid.  She has noted an occasional wheeze and dry cough still but has not had any fevers or chest pain.  States she intermittently feels like her heart is racing.  She was given Ativan at [facility] this morning for the same with improvement, and at previous visit on 9/23 which she was prescribed hydroxyzine.  She is taking 25 mg as needed but has not noted much benefit.  She does not currently have a PCP and has not followed up outpatient regarding the symptoms.        Past Medical History:  Diagnosis Date  . Anxiety   . Palpitations   . VSD (ventricular septal defect)    Possible history with spontaneous closure     Patient Active Problem List   Diagnosis Date Noted  . ORTHOSTATIC HYPOTENSION 01/11/2010  . 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 PREMATURE VENTRICULAR CONTRACTIONS 01/09/2010  . HYPERTENSION, GESTATIONAL 01/09/2010  . SYNCOPE 01/09/2010  . HEADACHE 01/09/2010  . PAP SMEAR, ABNORMAL 01/09/2010    Past Surgical History:  Procedure Laterality Date  . TUBAL LIGATION       OB History   No obstetric history on file.     No family history on file.  Social History   Tobacco Use  . Smoking status: Current Every Day Smoker  . Smokeless tobacco: Never Used  Substance Use Topics  . Alcohol use: Yes  . Drug use: No    Home Medications Prior to Admission medications   Medication Sig Start Date End Date Taking? Authorizing Provider  hydrOXYzine (ATARAX/VISTARIL) 25 MG tablet Take 1 tablet (25 mg total) by mouth every 8 (eight) hours as needed for up to 20 days. 11/18/19 12/08/19  Farrel Gordon, PA-C  ibuprofen (ADVIL) 200 MG tablet Take 600 mg by mouth every 6 (six) hours as needed for fever, headache or mild pain.    [provider]  Multiple Vitamin (MULTIVITAMIN WITH MINERALS) TABS tablet Take 1 tablet by mouth daily.    [provider]  oxymetazoline (AFRIN) 0.05 % nasal spray Place 1-2 sprays into both nostrils 2 (two) times daily as needed for congestion.    [provider]  Allergies    Penicillins, Sulfonamide derivatives, and Amoxicillin  Review of Systems   Review of Systems  Constitutional: Negative for chills and fever.  HENT: Negative.   Respiratory: Positive for cough and wheezing. Negative for shortness of breath.   Cardiovascular: Positive for palpitations. Negative for chest pain.  Gastrointestinal: Negative for abdominal pain, nausea and vomiting.  Musculoskeletal: Negative for myalgias.  Skin: Negative for color change and rash.  Neurological: Positive for tremors. Negative for syncope, light-headedness and headaches.   Psychiatric/Behavioral: The patient is nervous/anxious.   All other systems reviewed and are negative.   Physical Exam Updated Vital Signs BP (!) 129/59 (BP Location: Left Arm)   Pulse 82   Temp 98.6 F (37 C) (Oral)   Resp 14   Ht 5\' 4"  (1.626 m)   Wt 65.8 kg   LMP 11/01/2019 (Approximate)   SpO2 99%   BMI 24.89 kg/m   Physical Exam Vitals and nursing note reviewed.  Constitutional:      General: She is not in acute distress.    Appearance: Normal appearance. She is well-developed. She is not ill-appearing or diaphoretic.     Comments: Patient appears very anxious, somewhat tearful and tremulous but in no acute distress  HENT:     Head: Normocephalic and atraumatic.     Mouth/Throat:     Mouth: Mucous membranes are moist.     Pharynx: Oropharynx is clear.  Eyes:     General:        Right eye: No discharge.        Left eye: No discharge.  Cardiovascular:     Rate and Rhythm: Normal rate and regular rhythm.     Pulses: Normal pulses.     Heart sounds: Normal heart sounds. No murmur heard.  No friction rub. No gallop.   Pulmonary:     Effort: Pulmonary effort is normal. No respiratory distress.     Breath sounds: Wheezing present. No rales.     Comments: Respirations equal and unlabored, patient satting well on room air, she does have a few scattered faint expiratory wheezes, but good air movement, no rales or rhonchi Abdominal:     General: Bowel sounds are normal. There is no distension.     Palpations: Abdomen is soft. There is no mass.     Tenderness: There is no abdominal tenderness. There is no guarding.     Comments: Abdomen soft, nondistended, nontender to palpation in all quadrants without guarding or peritoneal signs  Musculoskeletal:        General: No deformity.     Cervical back: Neck supple.     Right lower leg: No edema.     Left lower leg: No edema.  Skin:    General: Skin is warm and dry.     Capillary Refill: Capillary refill takes less than 2  seconds.  Neurological:     Mental Status: She is alert.     Coordination: Coordination normal.     Comments: Speech is clear, able to follow commands Moves extremities without ataxia, coordination intact  Psychiatric:        Attention and Perception: Attention normal.        Mood and Affect: Mood is anxious. Affect is tearful.        Speech: Speech normal.        Behavior: Behavior normal. Behavior is cooperative.        Thought Content: Thought content does not include suicidal ideation.  ED Results / Procedures / Treatments   Labs (all labs ordered are listed, but only abnormal results are displayed) Labs Reviewed  BASIC METABOLIC PANEL - Abnormal; Notable for the following components:      Result Value   CO2 21 (*)    Glucose, Bld 107 (*)    All other components within normal limits  CBC - Abnormal; Notable for the following components:   Platelets 405 (*)    All other components within normal limits  I-STAT BETA HCG BLOOD, ED ([facility], [facility], [facility] ONLY) - Abnormal; Notable for the following components:   I-stat hCG, quantitative 5.2 (*)    All other components within normal limits  TROPONIN I (HIGH SENSITIVITY)    EKG EKG Interpretation  Date/Time:  Wednesday November 24 2019 10:36:15 EDT Ventricular Rate:  83 PR Interval:  132 QRS Duration: 72 QT Interval:  376 QTC Calculation: 441 R Axis:   87 Text Interpretation: Normal sinus rhythm Right atrial enlargement Borderline ECG No STEMI Confirmed by Alona BeneLong, Joshua [phone]([id number]) on 11/24/2019 2:21:58 PM   Radiology DG Chest 2 View  Result Date: 11/24/2019 CLINICAL DATA:  Shortness of breath EXAM: CHEST - 2 VIEW COMPARISON:  11/18/2019 FINDINGS: The heart size and mediastinal contours are within normal limits. Both lungs are clear. The visualized skeletal structures are unremarkable. IMPRESSION: No active cardiopulmonary disease. Electronically Signed   By: Duanne GuessNicholas  Plundo D.O.   On: 11/24/2019 14:58    Procedures Procedures  (including critical care time)  Medications Ordered in ED Medications  albuterol (VENTOLIN HFA) 108 (90 Base) MCG/ACT inhaler 2 puff (2 puffs Inhalation Given 11/24/19 1513)  LORazepam (ATIVAN) tablet 1 mg (1 mg Oral Given 11/24/19 1513)    ED Course  I have reviewed the triage vital signs and the nursing notes.  Pertinent labs & imaging results that were available during my care of the patient were reviewed by me and considered in my medical decision making (see chart for details).    MDM Rules/Calculators/A&P                          Patient presents with palpitations and anxiety, she has been evaluated earlier today and previously last week for similar symptoms, on arrival she has normal vitals and appears very anxious and slightly tremulous but in no acute distress.  Her symptoms have been responsive to Ativan on previous visits and she reports that she has not gotten much benefit from prescribed hydroxyzine.  She is very concerned about palpitations, and she states that she was evaluated for this previously.  Reviewed records and patient had 7-day device monitoring which showed no arrhythmia or PACs I provided patient this information which was very reassuring.  She also reports some continued cough and wheezing after recent Covid infection, no chest pain or persistent shortness of breath.  In addition to the lab work that was done in triage will repeat chest x-ray as it was last done on 9/23.  Will give albuterol and Ativan  Labs ordered from triage are reassuring, no electrolyte derangements, no leukocytosis, normal hemoglobin, normal troponin.  EKG shows normal sinus rhythm with no concerning changes.  Chest x-ray is clear  I discussed reassuring work-up with the patient, Ativan has helped with her anxiety significantly.  Placed social work consult to help patient with resources for PCP follow-up and they have spoken with the patient and provided resources and assistance.  At this time  patient is feeling much  better I have stressed the importance of outpatient follow-up and the limited interventions that can be done in the emergency department for anxiety.  Do think the patient would likely benefit from SSRI therapy to help better manage her anxiety but explained that this is not safe to begin in the emergency department.  Patient expresses understanding and agreement with plan.  Discharged home in good condition.  Final Clinical Impression(s) / ED Diagnoses Final diagnoses:  Anxiety  Palpitations  Wheezing    Rx / DC Orders ED Discharge Orders    None       Legrand Rams 11/24/19 2105    Maia Plan, MD 11/25/19 1014

## 2019-11-24 NOTE — Progress Notes (Signed)
   11/24/19 1625  TOC ED Mini Assessment  TOC Time spent with patient (minutes): 40  PING Used in TOC Assessment No  Admission or Readmission Diverted Yes  Interventions which prevented an admission or readmission Patient counseling  What brought you to the Emergency Department?  Anxiety  Barriers to Discharge No Barriers Identified  Means of departure Car  Patient states their goals for this hospitalization and ongoing recovery are: Help managing anxiety  CMS Medicare.gov Compare Post Acute Care list provided to: Patient  Choice offered to / list presented to  Patient  CSW met with Pt at bedside. Pt reports experiencing chronic anxiety coupled with situational issues that make it difficult to access current PCP.   CSW provided Pt education about working with managed Medicaid and utilizing the number on the back of Medicaid care to contact insurance and inform them that Pt has been in the ED and needs PCP closer to Pts home in order to manage physical symptoms. CSW and Pt also discussed utilizing this method to connect with telehealth therapist within network to help with trigger management. Pt expressed understanding and thanks

## 2019-11-24 NOTE — Discharge Instructions (Signed)
Use hydroxyzine 1 to 2 tablets as needed for anxiety.  Call to schedule follow-up with primary care doctor for further management of your anxiety as we discussed.  Your lab work, EKG and chest x-ray are very reassuring.  You did have some slight wheezing and you can use albuterol 2 puffs every 4-6 hours as needed.

## 2019-11-26 ENCOUNTER — Emergency Department (HOSPITAL_COMMUNITY)
Admission: EM | Admit: 2019-11-26 | Discharge: 2019-11-29 | Disposition: A | Discharge status: Home / Self Care | Payer: Medicaid Other | Attending: Emergency Medicine | Admitting: Emergency Medicine

## 2019-11-26 ENCOUNTER — Emergency Department (HOSPITAL_COMMUNITY): Payer: Medicaid Other

## 2019-11-26 ENCOUNTER — Encounter (HOSPITAL_COMMUNITY): Payer: Self-pay

## 2019-11-26 DIAGNOSIS — F419 Anxiety disorder, unspecified: Secondary | ICD-10-CM | POA: Diagnosis not present

## 2019-11-26 DIAGNOSIS — F17210 Nicotine dependence, cigarettes, uncomplicated: Secondary | ICD-10-CM | POA: Diagnosis not present

## 2019-11-26 DIAGNOSIS — Z8616 Personal history of COVID-19: Secondary | ICD-10-CM | POA: Insufficient documentation

## 2019-11-26 DIAGNOSIS — R202 Paresthesia of skin: Secondary | ICD-10-CM | POA: Diagnosis not present

## 2019-11-26 DIAGNOSIS — R0789 Other chest pain: Secondary | ICD-10-CM | POA: Diagnosis present

## 2019-11-26 LAB — CBC
HCT: 39.8 % (ref 36.0–46.0)
Hemoglobin: 13.5 g/dL (ref 12.0–15.0)
MCH: 29.9 pg (ref 26.0–34.0)
MCHC: 33.9 g/dL (ref 30.0–36.0)
MCV: 88.2 fL (ref 80.0–100.0)
Platelets: 359 10*3/uL (ref 150–400)
RBC: 4.51 MIL/uL (ref 3.87–5.11)
RDW: 12.1 % (ref 11.5–15.5)
WBC: 10.1 10*3/uL (ref 4.0–10.5)
nRBC: 0 % (ref 0.0–0.2)

## 2019-11-26 LAB — HEPATIC FUNCTION PANEL
ALT: 17 U/L (ref 0–44)
AST: 15 U/L (ref 15–41)
Albumin: 3.9 g/dL (ref 3.5–5.0)
Alkaline Phosphatase: 29 U/L — ABNORMAL LOW (ref 38–126)
Bilirubin, Direct: <0.1 mg/dL (ref 0.0–0.2)
Total Bilirubin: 0.7 mg/dL (ref 0.3–1.2)
Total Protein: 7.1 g/dL (ref 6.5–8.1)

## 2019-11-26 LAB — BASIC METABOLIC PANEL
Anion gap: 11 (ref 5–15)
BUN: 8 mg/dL (ref 6–20)
CO2: 22 mmol/L (ref 22–32)
Calcium: 9.7 mg/dL (ref 8.9–10.3)
Chloride: 105 mmol/L (ref 98–111)
Creatinine, Ser: 0.66 mg/dL (ref 0.44–1.00)
GFR calc Af Amer: >60 mL/min (ref >60)
GFR calc non Af Amer: >60 mL/min (ref >60)
Glucose, Bld: 109 mg/dL — ABNORMAL HIGH (ref 70–99)
Potassium: 3.2 mmol/L — ABNORMAL LOW (ref 3.5–5.1)
Sodium: 138 mmol/L (ref 135–145)

## 2019-11-26 LAB — LIPASE, BLOOD: Lipase: 29 U/L (ref 11–51)

## 2019-11-26 LAB — I-STAT BETA HCG BLOOD, ED (MC, WL, AP ONLY): I-stat hCG, quantitative: <5 m[IU]/mL (ref <5)

## 2019-11-26 LAB — TROPONIN I (HIGH SENSITIVITY): Troponin I (High Sensitivity): <2 ng/L (ref <18)

## 2019-11-26 LAB — CBG MONITORING, ED: Glucose-Capillary: 113 mg/dL — ABNORMAL HIGH (ref 70–99)

## 2019-11-26 MED ORDER — ALUM & MAG HYDROXIDE-SIMETH 200-200-20 MG/5ML PO SUSP
30.0000 mL | Freq: Once | ORAL | Status: AC
  Administered 2019-11-26: 30 mL via ORAL
  Filled 2019-11-26: qty 30

## 2019-11-26 NOTE — ED Notes (Signed)
Patient anxious and now in recliner. No head injury.

## 2019-11-26 NOTE — ED Notes (Signed)
This NT witnessed the pt threw herself onto the floor in the waiting room. Pt was getting extremely worked up saying "I am going to have a heart attack." After asking about her history I told her I would go get the triage nurse to assist. Before I could walk away the pt then threw herself onto her hands on the floor and began to wail. Triage nurse was notified and pt was assisted off the floor and into a recliner.

## 2019-11-26 NOTE — ED Provider Notes (Signed)
MSE was initiated and I personally evaluated the patient and placed orders (if any) at  9:05 PM on November 26, 2019.  The patient appears stable so that the remainder of the MSE may be completed by another provider.  I was asked to see patient in triage.  She had reportedly slid out of the chair in the waiting room landing on her hands and knees while crying and screaming.  She did not appear to strike her head per staff that witnessed.  Patient denies any injuries from this.  She states that "if I am having an anxiety attack just give me something."  She reports epigastric chest pain.  She also reports that she has H. pylori that is not treated because "every time I start the antibiotics it makes my heart out of whack."  Patient is tremulous and appears very anxious.  She has bilateral radial pulses that are palpable, heart rate is in the 60s to 70s and regular.  Moves all 4 extremities spontaneously, regular rate and rhythm, no murmurs.  Lungs clear to auscultation bilaterally.  Her troponin that was obtained earlier was negative.  GI cocktail ordered.  Patient has significant anxiety history.  At this point she appears anxious however hemodynamically stable to await full evaluation.  Fall precautions.   She did not have CXR ordered which I ordered.  Along with Lipase and hepatic function panel given the epigastric nature of her symptoms.   Note: Portions of this report may have been transcribed using voice recognition software. Every effort was made to ensure accuracy; however, inadvertent computerized transcription errors may be present.      Cristina Gong, Cordelia Poche 11/26/19 2114    Melene Plan, DO 11/26/19 2212

## 2019-11-26 NOTE — ED Triage Notes (Signed)
Pt reports chest pain and anxiety attack that started about 45 mins ago, hx of same, seen here 3 times within the last week. Pt anxious in triage.

## 2019-11-27 LAB — RAPID URINE DRUG SCREEN, HOSP PERFORMED
Amphetamines: NOT DETECTED
Barbiturates: NOT DETECTED
Benzodiazepines: NOT DETECTED
Cocaine: NOT DETECTED
Opiates: NOT DETECTED
Tetrahydrocannabinol: POSITIVE — AB

## 2019-11-27 LAB — URINALYSIS, ROUTINE W REFLEX MICROSCOPIC
Bilirubin Urine: NEGATIVE
Glucose, UA: NEGATIVE mg/dL
Hgb urine dipstick: NEGATIVE
Ketones, ur: 5 mg/dL — AB
Leukocytes,Ua: NEGATIVE
Nitrite: NEGATIVE
Protein, ur: 30 mg/dL — AB
Specific Gravity, Urine: 1.028 (ref 1.005–1.030)
pH: 6 (ref 5.0–8.0)

## 2019-11-27 LAB — TROPONIN I (HIGH SENSITIVITY): Troponin I (High Sensitivity): 6 ng/L (ref <18)

## 2019-11-27 MED ORDER — ONDANSETRON 4 MG PO TBDP
4.0000 mg | ORAL_TABLET | Freq: Once | ORAL | Status: AC
  Administered 2019-11-27: 4 mg via ORAL
  Filled 2019-11-27: qty 1

## 2019-11-27 MED ORDER — ALUM & MAG HYDROXIDE-SIMETH 200-200-20 MG/5ML PO SUSP
15.0000 mL | Freq: Once | ORAL | Status: AC
  Administered 2019-11-27: 15 mL via ORAL
  Filled 2019-11-27: qty 30

## 2019-11-27 MED ORDER — LIDOCAINE VISCOUS HCL 2 % MT SOLN
15.0000 mL | Freq: Once | OROMUCOSAL | Status: AC
  Administered 2019-11-27: 15 mL via OROMUCOSAL
  Filled 2019-11-27: qty 15

## 2019-11-27 MED ORDER — SUCRALFATE 1 G PO TABS
1.0000 g | ORAL_TABLET | Freq: Once | ORAL | Status: AC
  Administered 2019-11-27: 1 g via ORAL
  Filled 2019-11-27: qty 1

## 2019-11-27 MED ORDER — HYDROXYZINE HCL 25 MG PO TABS
25.0000 mg | ORAL_TABLET | Freq: Once | ORAL | Status: DC
  Filled 2019-11-27: qty 1

## 2019-11-27 NOTE — ED Provider Notes (Addendum)
[facility] Provider Note  CSN: [id number] Arrival date & time: 11/26/19 1611  Chief Complaint(s) Chest Pain and Anxiety  HPI Kaitlyn Vargas is a 39 y.o. female with a past medical history listed below including recent Covid infection with symptom onset on September 14 and tested positive on September 17.  Patient reports that she has recovered from that.  Presented today for recurrent symptoms of left-sided chest squeezing and arm tingling and squeezing.  She also reports that she has a sinking sensation.  Reports fluttering of her heart.  Reports that she has had more frequent episodes over the past several weeks.  She has been seen in the emergency department 4 times in the last 1-1/2 weeks.  Symptoms resolved after dose of Ativan.  She had cardiac work-up ruling out ACS.  Patient reports that her symptoms are episodic and lasts several minutes at a time.  Occur numerous times during the day.  Patient does report a history of H. pylori with incomplete treatment because she reports that the medication that she takes makes her heart flutter.  Patient did admit to social marijuana use but reports that she has not used in over 2 months.  She reports quitting cigarette smoking 3 months ago.  Denies any other illicit drug use.  No recent alcohol use.  HPI  Past Medical History Past Medical History:  Diagnosis Date  . Anxiety   . Palpitations   . VSD (ventricular septal defect)    Possible history with spontaneous closure   Patient Active Problem List   Diagnosis Date Noted  . ORTHOSTATIC HYPOTENSION 01/11/2010  . 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 PREMATURE VENTRICULAR CONTRACTIONS 01/09/2010  . HYPERTENSION, GESTATIONAL 01/09/2010  . SYNCOPE 01/09/2010  . HEADACHE 01/09/2010  . PAP SMEAR, ABNORMAL 01/09/2010   Home Medication(s) Prior to Admission medications   Medication Sig Start Date End Date Taking? Authorizing Provider  hydrOXYzine  (ATARAX/VISTARIL) 25 MG tablet Take 1 tablet (25 mg total) by mouth every 8 (eight) hours as needed for up to 20 days. 11/18/19 12/08/19  Farrel GordonPatel, Shalyn, PA-C  ibuprofen (ADVIL) 200 MG tablet Take 600 mg by mouth every 6 (six) hours as needed for fever, headache or mild pain.    [provider]  Multiple Vitamin (MULTIVITAMIN WITH MINERALS) TABS tablet Take 1 tablet by mouth daily.    [provider]  oxymetazoline (AFRIN) 0.05 % nasal spray Place 1-2 sprays into both nostrils 2 (two) times daily as needed for congestion.    [provider]                                                                                                                                    Past Surgical History Past Surgical History:  Procedure Laterality Date  . TUBAL LIGATION     Family History No family history on file.  Social History Social History   Tobacco  Use  . Smoking status: Current Every Day Smoker  . Smokeless tobacco: Never Used  Substance Use Topics  . Alcohol use: Yes  . Drug use: No   Allergies Penicillins, Sulfonamide derivatives, and Amoxicillin  Review of Systems Review of Systems All other systems are reviewed and are negative for acute change except as noted in the HPI  Physical Exam Vital Signs  I have reviewed the triage vital signs BP 124/78   Pulse 79   Temp 98.9 F (37.2 C) (Oral)   Resp (!) 26   Ht 5\' 4"  (1.626 m)   Wt 65.8 kg   LMP 11/01/2019 (Approximate)   SpO2 98%   BMI 24.89 kg/m   Physical Exam Vitals reviewed.  Constitutional:      General: She is not in acute distress.    Appearance: She is well-developed. She is not diaphoretic.  HENT:     Head: Normocephalic and atraumatic.     Nose: Nose normal.  Eyes:     General: No scleral icterus.       Right eye: No discharge.        Left eye: No discharge.     Conjunctiva/sclera: Conjunctivae normal.     Pupils: Pupils are equal, round, and reactive to light.  Cardiovascular:      Rate and Rhythm: Normal rate and regular rhythm.     Heart sounds: No murmur heard.  No friction rub. No gallop.   Pulmonary:     Effort: Pulmonary effort is normal. No respiratory distress.     Breath sounds: Normal breath sounds. No stridor. No rales.  Abdominal:     General: There is no distension.     Palpations: Abdomen is soft.     Tenderness: There is no abdominal tenderness.  Musculoskeletal:        General: No tenderness.     Cervical back: Normal range of motion and neck supple.  Skin:    General: Skin is warm and dry.     Findings: No erythema or rash.  Neurological:     Mental Status: She is alert and oriented to person, place, and time.  Psychiatric:        Mood and Affect: Mood is anxious.     ED Results and Treatments Labs (all labs ordered are listed, but only abnormal results are displayed) Labs Reviewed  BASIC METABOLIC PANEL - Abnormal; Notable for the following components:      Result Value   Potassium 3.2 (*)    Glucose, Bld 109 (*)    All other components within normal limits  HEPATIC FUNCTION PANEL - Abnormal; Notable for the following components:   Alkaline Phosphatase 29 (*)    All other components within normal limits  RAPID URINE DRUG SCREEN, HOSP PERFORMED - Abnormal; Notable for the following components:   Tetrahydrocannabinol POSITIVE (*)    All other components within normal limits  URINALYSIS, ROUTINE W REFLEX MICROSCOPIC - Abnormal; Notable for the following components:   Color, Urine AMBER (*)    APPearance CLOUDY (*)    Ketones, ur 5 (*)    Protein, ur 30 (*)    Bacteria, UA FEW (*)    All other components within normal limits  CBG MONITORING, ED - Abnormal; Notable for the following components:   Glucose-Capillary 113 (*)    All other components within normal limits  CBC  LIPASE, BLOOD  I-STAT BETA HCG BLOOD, ED ([facility], [facility], [facility] ONLY)  TROPONIN I (HIGH SENSITIVITY)  TROPONIN  I (HIGH SENSITIVITY)                                                                                                                          EKG  EKG Interpretation  Date/Time:  Saturday November 27 2019 01:23:09 EDT Ventricular Rate:  81 PR Interval:    QRS Duration: 85 QT Interval:  392 QTC Calculation: 455 R Axis:   88 Text Interpretation: Sinus rhythm No acute changes Confirmed by Drema Pry [phone]) on 11/27/2019 2:35:44 AM      Radiology DG Chest 2 View  Result Date: 11/26/2019 CLINICAL DATA:  Chest pain, anxiety EXAM: CHEST - 2 VIEW COMPARISON:  chest x-ray 11/24/2019, chest x-ray 11/18/2019 FINDINGS: Findin The heart size and mediastinal contours are within normal limits. No focal consolidation. No pulmonary edema. No pleural effusion. No pneumothorax. No acute osseous abnormality. gs IMPRESSION: No active cardiopulmonary disease. Electronically Signed   By: Tish Frederickson M.D.   On: 11/26/2019 21:26    Pertinent labs & imaging results that were available during my care of the patient were reviewed by me and considered in my medical decision making (see chart for details).  Medications Ordered in ED Medications  hydrOXYzine (ATARAX/VISTARIL) tablet 25 mg (has no administration in time range)  alum & mag hydroxide-simeth (MAALOX/MYLANTA) 200-200-20 MG/5ML suspension 30 mL (30 mLs Oral Given 11/26/19 2130)  lidocaine (XYLOCAINE) 2 % viscous mouth solution 15 mL (15 mLs Mouth/Throat Given 11/27/19 0218)  sucralfate (CARAFATE) tablet 1 g (1 g Oral Given 11/27/19 0217)  alum & mag hydroxide-simeth (MAALOX/MYLANTA) 200-200-20 MG/5ML suspension 15 mL (15 mLs Oral Given 11/27/19 0218)  ondansetron (ZOFRAN-ODT) disintegrating tablet 4 mg (4 mg Oral Given 11/27/19 0217)                                                                                                                                    Procedures Procedures  (including critical care time)  Medical Decision Making / ED Course I have reviewed the nursing notes for this  encounter and the patient's prior records (if available in EHR or on provided paperwork).   Kisha Messman Zender was evaluated in Emergency Department on 11/27/2019 for the symptoms described in the history of present illness. She was evaluated in the context of the global COVID-19 pandemic, which necessitated consideration that the patient might be at risk for infection with the SARS-CoV-2 virus that  causes COVID-19. Institutional protocols and algorithms that pertain to the evaluation of patients at risk for COVID-19 are in a state of rapid change based on information released by regulatory bodies including the CDC and federal and state organizations. These policies and algorithms were followed during the patient's care in the ED.  Patient presents with episodic chest discomfort and palpitations. During my evaluation patient had similar episodes.  On telemetry patient's heart rate remained in the 70s to 90s.  No notable dysrhythmia, PVCs, or PACs.  Symptoms were able to be suppressed with therapeutic breathing.  EKG without acute ischemic changes or evidence of pericarditis.  Initial troponin is negative.  I have low suspicion for ACS.  Low suspicion for pulmonary embolism.  Presentation not classic for aortic dissection or esophageal perforation.  Patient may be having GI related symptoms which triggered her anxiety. Patient is requesting the same medication she was given the last few times.  Informed that benzodiazepines will not be provided to her during this visit.  We will treat her underlying symptoms.  Reiterated that patient needed to follow-up with a primary care provider.  Noted to be sleeping comfortably when I looked in and watched for several minutes while reassessing.   When she had to be awakened and moved to a hall bed, she began shaking and requesting anxiety medication.  Delta trop was negative, UA not concerning for UTI, and UDS +THC. Offered her Vistaril, which she agreed to  taking, but when RN came to give, she stated that this medicine is not working for her and she does not understand why we are giving it to her and not other medication.   I overheard this and came to reiterate that this is what we had agreed to provide her. She was informed that ativan or other benzos will not be provided for. She became more agitated and anxious, saying that we have not done anything to treat her anxiety. She also mentioned that she would go to another ER to treated. We recommended that she follow up with a PCP or even [facility] for assistance dealing with her anxiety.  Unfortunately, it appears to me that she is exhibiting drug seeking behavior.      Final Clinical Impression(s) / ED Diagnoses Final diagnoses:  Anxiety   The patient appears reasonably screened and/or stabilized for discharge and I doubt any other medical condition or other [facility] requiring further screening, evaluation, or treatment in the ED at this time prior to discharge. Safe for discharge with strict return precautions.  Disposition: Discharge  Condition: Good  I have discussed the results, Dx and Tx plan with the patient/family who expressed understanding and agree(s) with the plan. Discharge instructions discussed at length. The patient/family was given strict return precautions who verbalized understanding of the instructions. No further questions at time of discharge.    ED Discharge Orders    None       Follow Up: Primary care provider  Schedule an appointment as soon as possible for a visit  If you do not have a primary care physician, contact [facility] at [phone] for referral      This chart was dictated using voice recognition software.  Despite best efforts to proofread,  errors can occur which can change the documentation meaning.   Nira Conn, MD 11/27/19 0448    Nira Conn, MD 11/27/19 [phone]

## 2019-11-27 NOTE — ED Notes (Signed)
Extended conversation with patient regarding follow up and medications.  Pt concerned about taking Atarax may cause drop in BP, pt states she now feels like her BP is high. Pt requesting "medication I had last time", myself and Dr. Eudelia Bunch explained to patient we would not be giving Ativan and Atarax was again offered. Pt then took her own Atarax and stated she was sure she was going to pass out when she stood up, pt explained she needs to follow up with [facility] to get started on SSRI, pt upset, stating she is going to another ED. Pt states she does not understand why nothing is being done for her. This RN again attempted to explain all the interventions and medications provided.  Pt assisted to BR by NT.  Pt now yelling on cell phone regarding lack of care provided. Will continue to monitor until pts ride arrives.

## 2021-05-14 ENCOUNTER — Ambulatory Visit: Payer: Self-pay | Admitting: Family Medicine

## 2021-06-14 ENCOUNTER — Ambulatory Visit: Payer: Medicaid Other | Admitting: Family Medicine

## 2022-05-15 ENCOUNTER — Ambulatory Visit: Payer: Medicaid Other | Admitting: Family Medicine

## 2022-06-19 ENCOUNTER — Ambulatory Visit: Payer: Medicaid Other | Admitting: Family Medicine

## 2022-09-05 ENCOUNTER — Ambulatory Visit: Payer: Medicaid Other | Admitting: Family Medicine
